# Patient Record
Sex: Female | Born: 1955
Health system: Southern US, Community
[De-identification: ages and names within clinical notes are randomized; demographics above are authoritative.]

## PROBLEM LIST (undated history)

## (undated) DIAGNOSIS — N200 Calculus of kidney: Secondary | ICD-10-CM

## (undated) DIAGNOSIS — I10 Essential (primary) hypertension: Secondary | ICD-10-CM

## (undated) DIAGNOSIS — E119 Type 2 diabetes mellitus without complications: Secondary | ICD-10-CM

## (undated) DIAGNOSIS — C439 Malignant melanoma of skin, unspecified: Secondary | ICD-10-CM

## (undated) DIAGNOSIS — M549 Dorsalgia, unspecified: Secondary | ICD-10-CM

## (undated) HISTORY — PX: SHOULDER ARTHROSCOPY: SHX128

## (undated) HISTORY — PX: ABDOMINAL HYSTERECTOMY: SHX81

## (undated) HISTORY — PX: KNEE ARTHROSCOPY: SUR90

## (undated) HISTORY — PX: MOUTH SURGERY: SHX715

## (undated) HISTORY — PX: GASTRECTOMY: SHX58

---

## 1997-08-23 ENCOUNTER — Encounter: Admission: RE | Admit: 1997-08-23 | Discharge: 1997-11-21 | Payer: Self-pay | Admitting: Family Medicine

## 1997-09-07 ENCOUNTER — Other Ambulatory Visit: Admission: RE | Admit: 1997-09-07 | Discharge: 1997-09-07 | Payer: Self-pay | Admitting: Family Medicine

## 1997-09-29 ENCOUNTER — Other Ambulatory Visit: Admission: RE | Admit: 1997-09-29 | Discharge: 1997-09-29 | Payer: Self-pay | Admitting: Family Medicine

## 1997-12-19 ENCOUNTER — Encounter: Admission: RE | Admit: 1997-12-19 | Discharge: 1998-03-19 | Payer: Self-pay | Admitting: Family Medicine

## 1998-07-05 ENCOUNTER — Ambulatory Visit (HOSPITAL_BASED_OUTPATIENT_CLINIC_OR_DEPARTMENT_OTHER): Admission: RE | Admit: 1998-07-05 | Discharge: 1998-07-05 | Payer: Self-pay | Admitting: General Surgery

## 2000-08-15 ENCOUNTER — Encounter: Payer: Self-pay | Admitting: Family Medicine

## 2000-08-15 ENCOUNTER — Encounter: Admission: RE | Admit: 2000-08-15 | Discharge: 2000-08-15 | Payer: Self-pay | Admitting: Family Medicine

## 2002-12-12 ENCOUNTER — Emergency Department (HOSPITAL_COMMUNITY): Admission: EM | Admit: 2002-12-12 | Discharge: 2002-12-12 | Payer: Self-pay | Admitting: Emergency Medicine

## 2005-12-02 ENCOUNTER — Encounter: Admission: RE | Admit: 2005-12-02 | Discharge: 2005-12-02 | Payer: Self-pay | Admitting: Family Medicine

## 2006-01-01 ENCOUNTER — Encounter: Admission: RE | Admit: 2006-01-01 | Discharge: 2006-01-01 | Payer: Self-pay | Admitting: Family Medicine

## 2006-06-28 ENCOUNTER — Encounter: Admission: RE | Admit: 2006-06-28 | Discharge: 2006-06-28 | Payer: Self-pay | Admitting: Orthopedic Surgery

## 2011-01-31 ENCOUNTER — Other Ambulatory Visit: Payer: Self-pay | Admitting: Obstetrics and Gynecology

## 2011-01-31 DIAGNOSIS — Z1231 Encounter for screening mammogram for malignant neoplasm of breast: Secondary | ICD-10-CM

## 2011-02-12 ENCOUNTER — Ambulatory Visit
Admission: RE | Admit: 2011-02-12 | Discharge: 2011-02-12 | Disposition: A | Discharge status: Home / Self Care | Payer: BC Managed Care – PPO | Source: Ambulatory Visit | Attending: Obstetrics and Gynecology | Admitting: Obstetrics and Gynecology

## 2011-02-12 DIAGNOSIS — Z1231 Encounter for screening mammogram for malignant neoplasm of breast: Secondary | ICD-10-CM

## 2012-01-24 ENCOUNTER — Other Ambulatory Visit: Payer: Self-pay | Admitting: Family Medicine

## 2012-01-24 DIAGNOSIS — Z1231 Encounter for screening mammogram for malignant neoplasm of breast: Secondary | ICD-10-CM

## 2012-03-03 ENCOUNTER — Ambulatory Visit
Admission: RE | Admit: 2012-03-03 | Discharge: 2012-03-03 | Disposition: A | Discharge status: Home / Self Care | Payer: BC Managed Care – PPO | Source: Ambulatory Visit | Attending: Family Medicine | Admitting: Family Medicine

## 2012-03-03 DIAGNOSIS — Z1231 Encounter for screening mammogram for malignant neoplasm of breast: Secondary | ICD-10-CM

## 2012-10-28 ENCOUNTER — Encounter: Payer: Self-pay | Admitting: Endocrinology

## 2012-10-28 ENCOUNTER — Ambulatory Visit (INDEPENDENT_AMBULATORY_CARE_PROVIDER_SITE_OTHER): Payer: BC Managed Care – PPO | Admitting: Endocrinology

## 2012-10-28 VITALS — BP 108/62 | HR 80 | Temp 97.9°F | Resp 12 | Ht 64.0 in | Wt 213.6 lb

## 2012-10-28 DIAGNOSIS — E119 Type 2 diabetes mellitus without complications: Secondary | ICD-10-CM

## 2012-10-28 LAB — HEMOGLOBIN A1C: Hgb A1c MFr Bld: 6.1 % (ref 4.6–6.5)

## 2012-10-28 LAB — COMPREHENSIVE METABOLIC PANEL
ALT: 18 U/L (ref 0–35)
AST: 19 U/L (ref 0–37)
Albumin: 3.7 g/dL (ref 3.5–5.2)
Calcium: 9.3 mg/dL (ref 8.4–10.5)
Chloride: 104 mEq/L (ref 96–112)
Potassium: 4.1 mEq/L (ref 3.5–5.1)

## 2012-10-28 LAB — LIPID PANEL: Total CHOL/HDL Ratio: 3

## 2012-10-28 LAB — MICROALBUMIN / CREATININE URINE RATIO: Microalb Creat Ratio: 0.6 mg/g (ref 0.0–30.0)

## 2012-10-28 NOTE — Patient Instructions (Addendum)
Please check blood sugars at least half the time about 2 hours after any meal and as directed on waking up. Please bring blood sugar monitor to each visit  Walk daily

## 2012-10-28 NOTE — Progress Notes (Signed)
Patient ID: Jo Roberts, female   DOB: 12/03/1955, 57 y.o.   MRN: [id number]  Jo Roberts is an 57 y.o. female.   Reason for Appointment: Diabetes follow-up   History of Present Illness    Diagnosis: Type 2 DIABETES MELITUS  She has had mild diabetes which has usually been well controlled. She did significantly better with her diet and weight control with adding Victoza and this has also stabilized her sugars No side effects with this Her A1c usually is in the upper normal range. The last HbgA1c was reported as 5.5 in 4/14 Detailed records are not available yet Her fasting readings are usually fairly good and recently has had excellent readings after meals  Oral hypoglycemic drugs: Metformin        Side effects from medications: None Compliance with medications and self-care: Usually fairly good, not exercising          Monitors blood glucose: Once a day.    Glucometer:           Blood Glucose readings from meter download: readings 2 hours after supper: 102-136 with average 121 Hypoglycemia frequency: Never.          Meals: 3 meals per day.          Physical activity: exercise: Has been a regular the last few months           Dietician visit: Most recent: Unknown         Wt Readings from Last 3 Encounters:  10/28/12 213 lb 9.6 oz (96.888 kg)    No results found for any previous visit.    Medication List       This list is accurate as of: 10/28/12  8:57 AM.  Always use your most recent med list.               ACCU-CHEK COMPACT STRIPS test strip  Generic drug:  glucose blood  1 each by Other route as needed for other. Use as instructed     ALPRAZolam 0.5 MG tablet  Commonly known as:  XANAX  Take 0.5 mg by mouth at bedtime as needed for sleep (1/2 to 1 tablet as needed).     aspirin 81 MG tablet  Take 81 mg by mouth daily.     estradiol 1 MG tablet  Commonly known as:  ESTRACE  Take 1 mg by mouth daily.     lisinopril 5 MG tablet  Commonly known as:   PRINIVIL,ZESTRIL  Take 5 mg by mouth daily.     metFORMIN 500 MG 24 hr tablet  Commonly known as:  GLUCOPHAGE-XR  Take 500 mg by mouth daily with breakfast. 3 with breakfast     VICTOZA 18 MG/3ML Sopn  Generic drug:  Liraglutide  1.2 mg.     zolpidem 10 MG tablet  Commonly known as:  AMBIEN  Take 10 mg by mouth at bedtime as needed for sleep.        Allergies:  Allergies  Allergen Reactions  . Codeine     hallucinations    No past medical history on file.  No past surgical history on file.  No family history on file.  Social History:  reports that she has never smoked. She does not have any smokeless tobacco history on file. Her alcohol and drug histories are not on file.  Review of Systems:  She has not had any history of hypertension and apparently is on lisinopril from PCP for benefit of renal  protection  LIPIDS: last LDL level was 58 and she has not required pharmacological treatment  History of insomnia, recently has had more stress also      Examination:   BP 108/62  Pulse 80  Temp(Src) 97.9 F (36.6 C)  Resp 12  Ht 5\' 4"  (1.626 m)  Wt 213 lb 9.6 oz (96.888 kg)  BMI 36.65 kg/m2  SpO2 98%  Body mass index is 36.65 kg/(m^2).   ASSESSMENT/ PLAN::   Diabetes type 2   The patient's diabetes control appears to be excellent with nearly normal readings after meals Usually her fasting readings are good but A1c is pending Discussed that she does need to be consistent with exercise regimen and maintain diet for further weight loss She will continue her regimen of Victoza and metformin which she is tolerating well She will followup in 6 months  Simona Rocque 10/28/2012, 8:57 AM   Office Visit on 10/28/2012  Component Date Value Range Status  . Microalb, Ur 10/28/2012 1.8  0.0 - 1.9 mg/dL Final  . Creatinine,U 60/45/4098 278.8   Final  . Microalb Creat Ratio 10/28/2012 0.6  0.0 - 30.0 mg/g Final  . Hemoglobin A1C 10/28/2012 6.1  4.6 - 6.5 % Final    Glycemic Control Guidelines for People with Diabetes:Non Diabetic:  <6%Goal of Therapy: <7%Additional Action Suggested:  >8%   . Sodium 10/28/2012 139  135 - 145 mEq/L Final  . Potassium 10/28/2012 4.1  3.5 - 5.1 mEq/L Final  . Chloride 10/28/2012 104  96 - 112 mEq/L Final  . CO2 10/28/2012 29  19 - 32 mEq/L Final  . Glucose, Bld 10/28/2012 91  70 - 99 mg/dL Final  . BUN 11/91/4782 14  6 - 23 mg/dL Final  . Creatinine, Ser 10/28/2012 0.7  0.4 - 1.2 mg/dL Final  . Total Bilirubin 10/28/2012 0.7  0.3 - 1.2 mg/dL Final  . Alkaline Phosphatase 10/28/2012 64  39 - 117 U/L Final  . AST 10/28/2012 19  0 - 37 U/L Final  . ALT 10/28/2012 18  0 - 35 U/L Final  . Total Protein 10/28/2012 7.2  6.0 - 8.3 g/dL Final  . Albumin 95/62/1308 3.7  3.5 - 5.2 g/dL Final  . Calcium 65/78/4696 9.3  8.4 - 10.5 mg/dL Final  . GFR 29/52/8413 87.19  >60.00 mL/min Final  . Cholesterol 10/28/2012 120  0 - 200 mg/dL Final   ATP III Classification       Desirable:  < 200 mg/dL               Borderline High:  200 - 239 mg/dL          High:  > = 244 mg/dL  . Triglycerides 10/28/2012 89.0  0.0 - 149.0 mg/dL Final   Normal:  <010 mg/dLBorderline High:  150 - 199 mg/dL  . HDL 10/28/2012 46.90  >39.00 mg/dL Final  . VLDL 27/25/3664 17.8  0.0 - 40.0 mg/dL Final  . LDL Cholesterol 10/28/2012 55  0 - 99 mg/dL Final  . Total CHOL/HDL Ratio 10/28/2012 3   Final                  Men          Women1/2 Average Risk     3.4          3.3Average Risk          5.0          4.42X Average Risk  9.6          7.13X Average Risk          15.0          11.0

## 2012-10-29 ENCOUNTER — Telehealth: Payer: Self-pay | Admitting: *Deleted

## 2012-10-29 NOTE — Progress Notes (Signed)
Quick Note:  Please let patient know that the lab results are all normal and no further action needed, A1c 6.1, higher than before but this lab has higher upper normal  ______

## 2012-10-29 NOTE — Telephone Encounter (Signed)
Pt is aware of test results.  

## 2012-10-29 NOTE — Telephone Encounter (Signed)
Message copied by Hermenia Bers on Thu Oct 29, 2012  8:17 AM ------      Message from: Reather Littler      Created: Thu Oct 29, 2012  8:09 AM       Please let patient know that the lab results are all normal and no further action needed, A1c 6.1, higher than before but this lab has higher upper normal       ------

## 2012-11-21 ENCOUNTER — Other Ambulatory Visit: Payer: Self-pay | Admitting: Endocrinology

## 2012-11-22 ENCOUNTER — Encounter: Payer: Self-pay | Admitting: Endocrinology

## 2012-11-23 ENCOUNTER — Other Ambulatory Visit: Payer: Self-pay | Admitting: *Deleted

## 2012-11-23 MED ORDER — LIRAGLUTIDE 18 MG/3ML ~~LOC~~ SOPN: 1.2000 mg | PEN_INJECTOR | Freq: Every day | SUBCUTANEOUS | Status: DC

## 2012-11-23 NOTE — Telephone Encounter (Signed)
Opened encounter in error  

## 2012-12-28 ENCOUNTER — Other Ambulatory Visit: Payer: Self-pay | Admitting: *Deleted

## 2012-12-28 MED ORDER — GLUCOSE BLOOD VI STRP: ORAL_STRIP | Status: AC

## 2012-12-29 ENCOUNTER — Other Ambulatory Visit: Payer: Self-pay | Admitting: *Deleted

## 2012-12-29 MED ORDER — METFORMIN HCL ER 500 MG PO TB24: ORAL_TABLET | ORAL | Status: DC

## 2013-01-21 ENCOUNTER — Other Ambulatory Visit: Payer: Self-pay

## 2013-03-04 ENCOUNTER — Other Ambulatory Visit: Payer: Self-pay

## 2013-03-04 DIAGNOSIS — Z1231 Encounter for screening mammogram for malignant neoplasm of breast: Secondary | ICD-10-CM

## 2013-03-08 ENCOUNTER — Other Ambulatory Visit: Payer: Self-pay | Admitting: *Deleted

## 2013-03-08 MED ORDER — METFORMIN HCL ER 500 MG PO TB24: ORAL_TABLET | ORAL | Status: DC

## 2013-03-16 ENCOUNTER — Ambulatory Visit
Admission: RE | Admit: 2013-03-16 | Discharge: 2013-03-16 | Disposition: A | Discharge status: Home / Self Care | Payer: BC Managed Care – PPO | Source: Ambulatory Visit

## 2013-03-16 DIAGNOSIS — Z1231 Encounter for screening mammogram for malignant neoplasm of breast: Secondary | ICD-10-CM

## 2013-08-23 ENCOUNTER — Other Ambulatory Visit: Payer: Self-pay | Admitting: Endocrinology

## 2013-11-17 ENCOUNTER — Telehealth: Payer: Self-pay | Admitting: Endocrinology

## 2013-11-17 NOTE — Telephone Encounter (Signed)
Records faxed.

## 2013-11-17 NOTE — Telephone Encounter (Signed)
Mellissa from [facility] need patient last office visit notes, and labs.  Attention Lenna Sciara Fax # [phone]

## 2014-02-10 ENCOUNTER — Encounter (HOSPITAL_BASED_OUTPATIENT_CLINIC_OR_DEPARTMENT_OTHER): Payer: Self-pay

## 2014-02-10 ENCOUNTER — Emergency Department (HOSPITAL_BASED_OUTPATIENT_CLINIC_OR_DEPARTMENT_OTHER)
Admission: EM | Admit: 2014-02-10 | Discharge: 2014-02-10 | Disposition: A | Discharge status: Home / Self Care | Payer: BC Managed Care – PPO | Attending: Emergency Medicine | Admitting: Emergency Medicine

## 2014-02-10 ENCOUNTER — Emergency Department (HOSPITAL_BASED_OUTPATIENT_CLINIC_OR_DEPARTMENT_OTHER): Payer: BC Managed Care – PPO

## 2014-02-10 DIAGNOSIS — N200 Calculus of kidney: Secondary | ICD-10-CM | POA: Insufficient documentation

## 2014-02-10 DIAGNOSIS — R109 Unspecified abdominal pain: Secondary | ICD-10-CM | POA: Diagnosis not present

## 2014-02-10 DIAGNOSIS — Z79899 Other long term (current) drug therapy: Secondary | ICD-10-CM | POA: Insufficient documentation

## 2014-02-10 DIAGNOSIS — E119 Type 2 diabetes mellitus without complications: Secondary | ICD-10-CM | POA: Diagnosis not present

## 2014-02-10 DIAGNOSIS — Z7982 Long term (current) use of aspirin: Secondary | ICD-10-CM | POA: Diagnosis not present

## 2014-02-10 DIAGNOSIS — M549 Dorsalgia, unspecified: Secondary | ICD-10-CM | POA: Diagnosis present

## 2014-02-10 DIAGNOSIS — I10 Essential (primary) hypertension: Secondary | ICD-10-CM | POA: Insufficient documentation

## 2014-02-10 DIAGNOSIS — N12 Tubulo-interstitial nephritis, not specified as acute or chronic: Secondary | ICD-10-CM

## 2014-02-10 HISTORY — DX: Type 2 diabetes mellitus without complications: E11.9

## 2014-02-10 HISTORY — DX: Essential (primary) hypertension: I10

## 2014-02-10 LAB — CBC WITH DIFFERENTIAL/PLATELET
BASOS ABS: 0 10*3/uL (ref 0.0–0.1)
BASOS PCT: 0 % (ref 0–1)
Eosinophils Absolute: 0 10*3/uL (ref 0.0–0.7)
Eosinophils Relative: 0 % (ref 0–5)
HEMATOCRIT: 37.3 % (ref 36.0–46.0)
HEMOGLOBIN: 12.1 g/dL (ref 12.0–15.0)
LYMPHS PCT: 8 % — AB (ref 12–46)
Lymphs Abs: 1.7 10*3/uL (ref 0.7–4.0)
MCH: 28 pg (ref 26.0–34.0)
MCHC: 32.4 g/dL (ref 30.0–36.0)
MCV: 86.3 fL (ref 78.0–100.0)
MONO ABS: 2 10*3/uL — AB (ref 0.1–1.0)
MONOS PCT: 10 % (ref 3–12)
NEUTROS ABS: 16.3 10*3/uL — AB (ref 1.7–7.7)
NEUTROS PCT: 82 % — AB (ref 43–77)
Platelets: 199 10*3/uL (ref 150–400)
RBC: 4.32 MIL/uL (ref 3.87–5.11)
RDW: 13.6 % (ref 11.5–15.5)
WBC: 20 10*3/uL — AB (ref 4.0–10.5)

## 2014-02-10 LAB — URINALYSIS, ROUTINE W REFLEX MICROSCOPIC
BILIRUBIN URINE: NEGATIVE
GLUCOSE, UA: NEGATIVE mg/dL
Ketones, ur: 15 mg/dL — AB
Nitrite: POSITIVE — AB
PROTEIN: 100 mg/dL — AB
Specific Gravity, Urine: 1.017 (ref 1.005–1.030)
UROBILINOGEN UA: 0.2 mg/dL (ref 0.0–1.0)
pH: 6 (ref 5.0–8.0)

## 2014-02-10 LAB — URINE MICROSCOPIC-ADD ON

## 2014-02-10 LAB — BASIC METABOLIC PANEL
ANION GAP: 14 (ref 5–15)
BUN: 9 mg/dL (ref 6–23)
CHLORIDE: 96 meq/L (ref 96–112)
CO2: 25 meq/L (ref 19–32)
Calcium: 9.2 mg/dL (ref 8.4–10.5)
Creatinine, Ser: 0.8 mg/dL (ref 0.50–1.10)
GFR calc non Af Amer: 80 mL/min — ABNORMAL LOW (ref >90)
Glucose, Bld: 176 mg/dL — ABNORMAL HIGH (ref 70–99)
POTASSIUM: 3.6 meq/L — AB (ref 3.7–5.3)
Sodium: 135 mEq/L — ABNORMAL LOW (ref 137–147)

## 2014-02-10 MED ORDER — CEFTRIAXONE SODIUM 1 G IJ SOLR
1.0000 g | Freq: Once | INTRAMUSCULAR | Status: AC
  Administered 2014-02-10: 1 g via INTRAVENOUS

## 2014-02-10 MED ORDER — ONDANSETRON HCL 4 MG/2ML IJ SOLN
4.0000 mg | Freq: Once | INTRAMUSCULAR | Status: AC
  Administered 2014-02-10: 4 mg via INTRAVENOUS
  Filled 2014-02-10: qty 2

## 2014-02-10 MED ORDER — ACETAMINOPHEN 325 MG PO TABS
650.0000 mg | ORAL_TABLET | Freq: Once | ORAL | Status: AC
  Administered 2014-02-10: 650 mg via ORAL
  Filled 2014-02-10: qty 2

## 2014-02-10 MED ORDER — CEFTRIAXONE SODIUM 1 G IJ SOLR
INTRAMUSCULAR | Status: AC
  Filled 2014-02-10: qty 10

## 2014-02-10 MED ORDER — SODIUM CHLORIDE 0.9 % IV BOLUS (SEPSIS)
1000.0000 mL | Freq: Once | INTRAVENOUS | Status: AC
  Administered 2014-02-10: 1000 mL via INTRAVENOUS

## 2014-02-10 MED ORDER — ONDANSETRON HCL 4 MG PO TABS: 4.0000 mg | ORAL_TABLET | Freq: Four times a day (QID) | ORAL | Status: DC

## 2014-02-10 MED ORDER — LEVOFLOXACIN 750 MG PO TABS
750.0000 mg | ORAL_TABLET | Freq: Once | ORAL | Status: AC
  Administered 2014-02-10: 750 mg via ORAL
  Filled 2014-02-10: qty 1

## 2014-02-10 MED ORDER — AMOXICILLIN-POT CLAVULANATE 875-125 MG PO TABS
1.0000 | ORAL_TABLET | Freq: Two times a day (BID) | ORAL | Status: DC
Start: 2014-02-10 — End: 2016-08-05

## 2014-02-10 NOTE — Discharge Instructions (Signed)
Please follow the directions provided.  Be sure to follow-up with your primary care provider as soon as possible.  You may also contact the urologist of choice to follow-up with the stone identified today.  You have a urinary tract infection and a kidney stone but your pain is not on the left side where the stone is located.  You should still strain your urine for the stone.  If your pain, fever or any other symptoms do not improve or worsen in any way, do not hesitate to return.     SEEK IMMEDIATE MEDICAL CARE IF:  You have a fever or persistent symptoms for more than 2-3 days.  You have a fever and your symptoms suddenly get worse.  You are unable to take your antibiotics or fluids.  You develop shaking chills.  You experience extreme weakness or fainting.  There is no improvement after 2 days of treatment.

## 2014-02-10 NOTE — ED Provider Notes (Signed)
CSN: [id number]     Arrival date & time 02/10/14  1237 History   First MD Initiated Contact with Patient 02/10/14 1358     Chief Complaint  Patient presents with  . back pain-from [place]    (Consider location/radiation/quality/duration/timing/severity/associated sxs/prior Treatment) HPI  Jo Roberts is a 58 yo female presenting with nausea, chills, and lower abd pain last night. She reports the pain progressed to right sided back and flank pain last night.  She had difficulty sleeping lsst night because of the chills and pain. This morning, she had a fever of 101.5. She was seen in a walk-in clinic and sent to the ED for further evaluation. Her last bowel movement was Tuesday. She describes the pain as a constant aching in her suprapubic abdomen that radiates to her right flank and right lower back.  She rates the pain as a 6/10.  She denies any chest pain, shortness of breath, weakness , vomiting or dysuria.   Past Medical History  Diagnosis Date  . Diabetes mellitus without complication   . Hypertension    History reviewed. No pertinent past surgical history. No family history on file. History  Substance Use Topics  . Smoking status: Never Smoker   . Smokeless tobacco: Not on file  . Alcohol Use: Not on file   OB History    No data available     Review of Systems  Constitutional: Positive for fever and chills.  HENT: Negative for sore throat.   Eyes: Negative for visual disturbance.  Respiratory: Negative for cough and shortness of breath.   Cardiovascular: Negative for chest pain and leg swelling.  Gastrointestinal: Positive for nausea and abdominal pain. Negative for vomiting and diarrhea.  Genitourinary: Positive for flank pain. Negative for dysuria.  Musculoskeletal: Positive for back pain. Negative for myalgias.  Skin: Negative for rash.  Neurological: Negative for weakness, numbness and headaches.   Allergies  Codeine  Home Medications   Prior to Admission  medications   Medication Sig Start Date End Date Taking? Authorizing Provider  ALPRAZolam Duanne Moron) 0.5 MG tablet Take 0.5 mg by mouth at bedtime as needed for sleep (1/2 to 1 tablet as needed).    Historical Provider, MD  aspirin 81 MG tablet Take 81 mg by mouth daily.    Historical Provider, MD  estradiol (ESTRACE) 1 MG tablet Take 1 mg by mouth daily.    Historical Provider, MD  glucose blood (ACCU-CHEK COMPACT STRIPS) test strip Use as instructed to check blood sugars once a day 12/28/12   Elayne Snare, MD  lisinopril (PRINIVIL,ZESTRIL) 5 MG tablet Take 5 mg by mouth daily.    Historical Provider, MD  metFORMIN (GLUCOPHAGE-XR) 500 MG 24 hr tablet Take 3 tablets  with breakfast 03/08/13   Elayne Snare, MD  zolpidem (AMBIEN) 10 MG tablet Take 10 mg by mouth at bedtime as needed for sleep.    Historical Provider, MD   BP 143/67 mmHg  Pulse 121  Temp(Src) 101.2 F (38.4 C) (Oral)  Resp 18  Wt 224 lb (101.606 kg)  SpO2 96% Physical Exam  Constitutional: She appears well-developed and well-nourished. No distress.  HENT:  Head: Normocephalic and atraumatic.  Mouth/Throat: Oropharynx is clear and moist. No oropharyngeal exudate.  Eyes: Conjunctivae are normal.  Neck: Neck supple. No thyromegaly present.  Cardiovascular: Normal rate, regular rhythm and intact distal pulses.   Pulmonary/Chest: Effort normal and breath sounds normal. No respiratory distress. She has no wheezes. She has no rales. She exhibits no  tenderness.  Abdominal: Soft. She exhibits no distension and no mass. There is tenderness in the suprapubic area. There is no rigidity, no rebound, no guarding, no CVA tenderness, no tenderness at McBurney's point and negative Murphy's sign.    Musculoskeletal: She exhibits no tenderness.  Lymphadenopathy:    She has no cervical adenopathy.  Neurological: She is alert.  Skin: Skin is warm and dry. No rash noted. She is not diaphoretic.  Psychiatric: She has a normal mood and affect.    Nursing note and vitals reviewed.   ED Course  Procedures (including critical care time) Labs Review Labs Reviewed  URINALYSIS, ROUTINE W REFLEX MICROSCOPIC - Abnormal; Notable for the following:    Color, Urine AMBER (*)    APPearance CLOUDY (*)    Hgb urine dipstick TRACE (*)    Ketones, ur 15 (*)    Protein, ur 100 (*)    Nitrite POSITIVE (*)    Leukocytes, UA SMALL (*)    All other components within normal limits  URINE MICROSCOPIC-ADD ON - Abnormal; Notable for the following:    Bacteria, UA MANY (*)    All other components within normal limits  CBC WITH DIFFERENTIAL - Abnormal; Notable for the following:    WBC 20.0 (*)    Neutrophils Relative % 82 (*)    Neutro Abs 16.3 (*)    Lymphocytes Relative 8 (*)    Monocytes Absolute 2.0 (*)    All other components within normal limits  BASIC METABOLIC PANEL - Abnormal; Notable for the following:    Sodium 135 (*)    Potassium 3.6 (*)    Glucose, Bld 176 (*)    GFR calc non Af Amer 80 (*)    All other components within normal limits  URINE CULTURE  CULTURE, BLOOD (ROUTINE X 2)  CULTURE, BLOOD (ROUTINE X 2)   Imaging Review    CT RENAL STONE STUDY (Final result) Result time: 02/10/14 14:36:27   Final result by Rad Results In Interface (02/10/14 14:36:27)   Narrative:   CLINICAL DATA: Low back pain and hematuria  EXAM: CT ABDOMEN AND PELVIS WITHOUT CONTRAST  TECHNIQUE: Multidetector CT imaging of the abdomen and pelvis was performed following the standard protocol without oral or intravenous contrast material administration.  COMPARISON: None.  FINDINGS: There is mild scarring in the left lung base. No edema or consolidation in the lung bases appreciable.  Liver is prominent, measuring 19.7 cm in length. There is hepatic steatosis. No focal liver lesions are identified. Gallbladder is absent. There is no appreciable biliary duct dilatation.  Spleen, pancreas, and adrenals appear normal.  There are is  no calculus, hydronephrosis, or mass in the right kidney. There is no right-sided ureteral calculus. On the left, there is perinephric stranding with moderate hydronephrosis. There is known left renal mass or intrarenal calculus. There is diffuse ureterectasis on the left. There is a 4 mm calculus at the level of the left ureterovesical junction. No other ureteral calculi are identified.  In the pelvis, the urinary bladder is midline with normal wall thickness. There is no appreciable pelvic mass or fluid collection. Uterus appears absent. Appendix appears normal. There is some mild mesenteric stranding in the left retroperitoneum, likely due to edema from the ureteral calculus on the left.  There is no bowel obstruction. No free air or portal venous air. There is no ascites, adenopathy, or abscess in the abdomen or pelvis. There is no demonstrable abdominal aortic aneurysm. There is degenerative change in  the lumbar spine. There are no blastic or lytic bone lesions. There is generalized spinal stenosis at L4-5, due to generalized disc protrusion, facet hypertrophy, and ligamentum flava hypertrophy bilaterally. There are no blastic or lytic bone lesions.  IMPRESSION: 4 mm calculus at the left ureterovesical junction causing moderate hydronephrosis and ureterectasis on the left.  Spinal stenosis at L4-5, multifactorial.  Prominent liver with hepatic steatosis.  No bowel obstruction. No abscess. Appendix appears normal.     EKG Interpretation None      MDM   Final diagnoses:  Flank pain  Pyelonephritis   57 yo female presenting with suprapubic and right flank pain.   CBC, BMP, UA, CT Stone Study, NS bolus, tylenol and zofran.  Labs reviewed: elevated WBC and Ab neutro, UA: positive for nitrites and small leukocytes, WBC in urine 21-50.  Blood and Urine culture added.  CT resulted: stone noted on left but none on right where pt's pain is.  Discussed case with Dr.  Johnney Killian.  Will treat as pyelonephritis.   IV rocephin and Levaquin Po given.  Discussed results with pt, would prefer discharge today because of responsibilities at home. Pt is well-appearing, in no acute distress and vital signs are stable.  They appear safe to be discharged.  Discharge include follow-up with their PCP and antibiotic to further treat pyelonephritis. Strict return precautions provided.  Pt aware of plan and in agreement.      Filed Vitals:   02/10/14 1246 02/10/14 1524 02/10/14 1738  BP: 143/67 113/51 116/64  Pulse: 121 86 80  Temp: 101.2 F (38.4 C) 98.5 F (36.9 C) 98 F (36.7 C)  TempSrc: Oral Oral Oral  Resp: 18 18 16   Weight: 224 lb (101.606 kg)    SpO2: 96% 99% 100%   Meds given in ED:  Medications  cefTRIAXone (ROCEPHIN) 1 g in dextrose 5 % 50 mL IVPB (not administered)  levofloxacin (LEVAQUIN) tablet 750 mg (not administered)  acetaminophen (TYLENOL) tablet 650 mg (650 mg Oral Given 02/10/14 1311)  ondansetron (ZOFRAN) injection 4 mg (4 mg Intravenous Given 02/10/14 1444)  sodium chloride 0.9 % bolus 1,000 mL (1,000 mLs Intravenous New Bag/Given 02/10/14 1444)    New Prescriptions   AMOXICILLIN-CLAVULANATE (AUGMENTIN) 875-125 MG PER TABLET    Take 1 tablet by mouth every 12 (twelve) hours.   ONDANSETRON (ZOFRAN) 4 MG TABLET    Take 1 tablet (4 mg total) by mouth every 6 (six) hours.       Britt Bottom, NP 02/13/14 [place], MD 02/13/14 [phone]

## 2014-02-10 NOTE — ED Notes (Signed)
Patient here from [facility] walk in clinic for further evaluation of back pain, chills and decreased urinary output x 1 day.

## 2014-02-12 LAB — URINE CULTURE: Special Requests: NORMAL

## 2014-02-13 NOTE — Progress Notes (Signed)
Pt was recently seen in the ED for UTI. Urine cx came back with klebsiella that is resistant to amp. She was dc on augmentin. Confirmed with lab today that it's sens to augmentin.   Onnie Boer, PharmD Pager: [phone] 02/13/2014 8:20 AM

## 2014-02-16 LAB — CULTURE, BLOOD (ROUTINE X 2)
CULTURE: NO GROWTH
Culture: NO GROWTH
SPECIAL REQUESTS: NORMAL
Special Requests: NORMAL

## 2014-03-02 ENCOUNTER — Other Ambulatory Visit: Payer: Self-pay

## 2014-03-02 DIAGNOSIS — Z1231 Encounter for screening mammogram for malignant neoplasm of breast: Secondary | ICD-10-CM

## 2014-03-16 ENCOUNTER — Ambulatory Visit
Admission: RE | Admit: 2014-03-16 | Discharge: 2014-03-16 | Disposition: A | Discharge status: Home / Self Care | Payer: BC Managed Care – PPO | Source: Ambulatory Visit

## 2014-03-16 DIAGNOSIS — Z1231 Encounter for screening mammogram for malignant neoplasm of breast: Secondary | ICD-10-CM

## 2016-08-05 ENCOUNTER — Encounter (HOSPITAL_BASED_OUTPATIENT_CLINIC_OR_DEPARTMENT_OTHER): Payer: Self-pay | Admitting: Emergency Medicine

## 2016-08-05 ENCOUNTER — Emergency Department (HOSPITAL_BASED_OUTPATIENT_CLINIC_OR_DEPARTMENT_OTHER)
Admission: EM | Admit: 2016-08-05 | Discharge: 2016-08-05 | Disposition: A | Discharge status: Home / Self Care | Payer: BC Managed Care – PPO | Attending: Emergency Medicine | Admitting: Emergency Medicine

## 2016-08-05 ENCOUNTER — Emergency Department (HOSPITAL_BASED_OUTPATIENT_CLINIC_OR_DEPARTMENT_OTHER): Payer: BC Managed Care – PPO

## 2016-08-05 DIAGNOSIS — R109 Unspecified abdominal pain: Secondary | ICD-10-CM

## 2016-08-05 DIAGNOSIS — Z7982 Long term (current) use of aspirin: Secondary | ICD-10-CM | POA: Insufficient documentation

## 2016-08-05 DIAGNOSIS — I10 Essential (primary) hypertension: Secondary | ICD-10-CM | POA: Insufficient documentation

## 2016-08-05 DIAGNOSIS — E119 Type 2 diabetes mellitus without complications: Secondary | ICD-10-CM | POA: Diagnosis not present

## 2016-08-05 DIAGNOSIS — Z79899 Other long term (current) drug therapy: Secondary | ICD-10-CM | POA: Insufficient documentation

## 2016-08-05 DIAGNOSIS — N201 Calculus of ureter: Secondary | ICD-10-CM | POA: Diagnosis not present

## 2016-08-05 HISTORY — DX: Calculus of kidney: N20.0

## 2016-08-05 HISTORY — DX: Dorsalgia, unspecified: M54.9

## 2016-08-05 LAB — CBC WITH DIFFERENTIAL/PLATELET
Basophils Absolute: 0 10*3/uL (ref 0.0–0.1)
Basophils Relative: 0 %
EOS ABS: 0.1 10*3/uL (ref 0.0–0.7)
Eosinophils Relative: 1 %
HCT: 37.2 % (ref 36.0–46.0)
HEMOGLOBIN: 12.3 g/dL (ref 12.0–15.0)
Lymphocytes Relative: 22 %
Lymphs Abs: 1.9 10*3/uL (ref 0.7–4.0)
MCH: 28.1 pg (ref 26.0–34.0)
MCHC: 33.1 g/dL (ref 30.0–36.0)
MCV: 85.1 fL (ref 78.0–100.0)
Monocytes Absolute: 0.7 10*3/uL (ref 0.1–1.0)
Monocytes Relative: 9 %
NEUTROS PCT: 68 %
Neutro Abs: 5.8 10*3/uL (ref 1.7–7.7)
Platelets: 145 10*3/uL — ABNORMAL LOW (ref 150–400)
RBC: 4.37 MIL/uL (ref 3.87–5.11)
RDW: 14.4 % (ref 11.5–15.5)
WBC: 8.6 10*3/uL (ref 4.0–10.5)

## 2016-08-05 LAB — COMPREHENSIVE METABOLIC PANEL
ALK PHOS: 85 U/L (ref 38–126)
ALT: 27 U/L (ref 14–54)
ANION GAP: 11 (ref 5–15)
AST: 26 U/L (ref 15–41)
Albumin: 3.8 g/dL (ref 3.5–5.0)
BUN: 16 mg/dL (ref 6–20)
CALCIUM: 9.3 mg/dL (ref 8.9–10.3)
CO2: 23 mmol/L (ref 22–32)
Chloride: 105 mmol/L (ref 101–111)
Creatinine, Ser: 0.84 mg/dL (ref 0.44–1.00)
GFR calc non Af Amer: >60 mL/min (ref >60)
Glucose, Bld: 142 mg/dL — ABNORMAL HIGH (ref 65–99)
POTASSIUM: 4 mmol/L (ref 3.5–5.1)
SODIUM: 139 mmol/L (ref 135–145)
Total Bilirubin: 0.4 mg/dL (ref 0.3–1.2)
Total Protein: 6.9 g/dL (ref 6.5–8.1)

## 2016-08-05 LAB — URINALYSIS, MICROSCOPIC (REFLEX)

## 2016-08-05 LAB — URINALYSIS, ROUTINE W REFLEX MICROSCOPIC
Glucose, UA: NEGATIVE mg/dL
Ketones, ur: 15 mg/dL — AB
NITRITE: NEGATIVE
Protein, ur: 30 mg/dL — AB
SPECIFIC GRAVITY, URINE: 1.035 — AB (ref 1.005–1.030)
pH: 5.5 (ref 5.0–8.0)

## 2016-08-05 LAB — LIPASE, BLOOD: Lipase: 38 U/L (ref 11–51)

## 2016-08-05 MED ORDER — HYDROMORPHONE HCL 1 MG/ML IJ SOLN
0.5000 mg | INTRAMUSCULAR | Status: DC | PRN
Start: 2016-08-05 — End: 2016-08-05
  Administered 2016-08-05: 0.5 mg via INTRAVENOUS
  Filled 2016-08-05: qty 1

## 2016-08-05 MED ORDER — SODIUM CHLORIDE 0.9 % IV SOLN: INTRAVENOUS | Status: DC

## 2016-08-05 MED ORDER — ONDANSETRON HCL 4 MG/2ML IJ SOLN
4.0000 mg | Freq: Once | INTRAMUSCULAR | Status: AC
  Administered 2016-08-05: 4 mg via INTRAVENOUS
  Filled 2016-08-05: qty 2

## 2016-08-05 MED ORDER — ONDANSETRON HCL 4 MG PO TABS: 4.0000 mg | ORAL_TABLET | Freq: Four times a day (QID) | ORAL | 0 refills | Status: DC

## 2016-08-05 MED ORDER — OXYCODONE-ACETAMINOPHEN 5-325 MG PO TABS: 1.0000 | ORAL_TABLET | Freq: Four times a day (QID) | ORAL | 0 refills | Status: DC | PRN

## 2016-08-05 MED ORDER — SODIUM CHLORIDE 0.9 % IV BOLUS (SEPSIS)
1000.0000 mL | Freq: Once | INTRAVENOUS | Status: AC
  Administered 2016-08-05: 1000 mL via INTRAVENOUS

## 2016-08-05 MED FILL — ONDANSETRON HCL 4 MG TABLET: 4 | 3 days supply | Qty: 12 | Fill #0

## 2016-08-05 MED FILL — OXYCODONE/APAP 5/325 MG TAB: 5-325 | 5 days supply | Qty: 20 | Fill #0

## 2016-08-05 NOTE — Discharge Instructions (Signed)
Take the medications as needed for pain, follow-up with the urologist to make sure the kidney stone passes, return to [facility] Emergency room as needed for worsening symptoms (if you needed additional emergent treatment, the urologist work out at [place])

## 2016-08-05 NOTE — ED Triage Notes (Signed)
Pt states last night she started having right side back pain.  Started having flank pain today work with nausea.  Pt has hx of kidney stones

## 2016-08-05 NOTE — ED Provider Notes (Signed)
[facility] DEPT [facility] Provider Note   CSN: [id number] Arrival date & time: 08/05/16  1202     History   Chief Complaint Chief Complaint  Patient presents with  . Back Pain  . Abdominal Pain    HPI Jo Roberts is a 61 y.o. female.  HPI Patient presents to emergency room with complaints of right-sided flank pain. Patient started had some pain last evening. She took a portion of a hydrocodone tablet. She was able to go to bed and when she woke up this morning she was feeling fine. Patient was at work today however when she had the sudden onset of sharp stabbing pain in the right flank. It started radiating towards the front of her abdomen. She has had kidney stones before but they were not this severe. She began nauseated and felt like she was getting lightheaded. She came to the emergency room for evaluation. Her pain has subsequently resolved and she is feeling fine right now. She denies any trouble with any chest pain or shortness of breath. No diarrhea. No hematuria Past Medical History:  Diagnosis Date  . Back pain   . Diabetes mellitus without complication ([place])   . Hypertension   . Kidney stone     Patient Active Problem List   Diagnosis Date Noted  . Type II or unspecified type diabetes mellitus without mention of complication, not stated as uncontrolled 10/28/2012    Past Surgical History:  Procedure Laterality Date  . ABDOMINAL HYSTERECTOMY    . GASTRECTOMY    . KNEE ARTHROSCOPY Right   . SHOULDER ARTHROSCOPY Right     OB History    No data available       Home Medications    Prior to Admission medications   Medication Sig Start Date End Date Taking? Authorizing Provider  ALPRAZolam Duanne Moron) 0.5 MG tablet Take 0.5 mg by mouth at bedtime as needed for sleep (1/2 to 1 tablet as needed).    [provider]  aspirin 81 MG tablet Take 81 mg by mouth daily.    [provider]  estradiol (ESTRACE) 1 MG tablet Take 1 mg by mouth daily.     [provider]  glucose blood (ACCU-CHEK COMPACT STRIPS) test strip Use as instructed to check blood sugars once a day 12/28/12   Elayne Snare, MD  lisinopril (PRINIVIL,ZESTRIL) 5 MG tablet Take 5 mg by mouth daily.    [provider]  metFORMIN (GLUCOPHAGE-XR) 500 MG 24 hr tablet Take 3 tablets  with breakfast 03/08/13   Elayne Snare, MD  ondansetron (ZOFRAN) 4 MG tablet Take 1 tablet (4 mg total) by mouth every 6 (six) hours. 08/05/16   Dorie Rank, MD  oxyCODONE-acetaminophen (PERCOCET) 5-325 MG tablet Take 1 tablet by mouth every 6 (six) hours as needed. 08/05/16   Dorie Rank, MD  zolpidem (AMBIEN) 10 MG tablet Take 10 mg by mouth at bedtime as needed for sleep.    [provider]    Family History No family history on file.  Social History Social History  Substance Use Topics  . Smoking status: Never Smoker  . Smokeless tobacco: Never Used  . Alcohol use No     Allergies   Codeine   Review of Systems Review of Systems  All other systems reviewed and are negative.    Physical Exam Updated Vital Signs BP (!) 175/74   Pulse 79   Temp 98.3 F (36.8 C) (Oral)   Resp 18   Ht 1.626  m (5\' 4" )   Wt 108.4 kg (239 lb)   SpO2 97%   BMI 41.02 kg/m   Physical Exam  Constitutional: She appears well-developed and well-nourished. No distress.  HENT:  Head: Normocephalic and atraumatic.  Right Ear: External ear normal.  Left Ear: External ear normal.  Eyes: Conjunctivae are normal. Right eye exhibits no discharge. Left eye exhibits no discharge. No scleral icterus.  Neck: Neck supple. No tracheal deviation present.  Cardiovascular: Normal rate, regular rhythm and intact distal pulses.   Pulmonary/Chest: Effort normal and breath sounds normal. No stridor. No respiratory distress. She has no wheezes. She has no rales.  Abdominal: Soft. Bowel sounds are normal. She exhibits no distension. There is no tenderness. There is no rebound and no guarding.    Musculoskeletal: She exhibits no edema or tenderness.  Neurological: She is alert. She has normal strength. No cranial nerve deficit (no facial droop, extraocular movements intact, no slurred speech) or sensory deficit. She exhibits normal muscle tone. She displays no seizure activity. Coordination normal.  Skin: Skin is warm and dry. No rash noted.  Psychiatric: She has a normal mood and affect.  Nursing note and vitals reviewed.    ED Treatments / Results  Labs (all labs ordered are listed, but only abnormal results are displayed) Labs Reviewed  COMPREHENSIVE METABOLIC PANEL - Abnormal; Notable for the following:       Result Value   Glucose, Bld 142 (*)    All other components within normal limits  CBC WITH DIFFERENTIAL/PLATELET - Abnormal; Notable for the following:    Platelets 145 (*)    All other components within normal limits  URINALYSIS, ROUTINE W REFLEX MICROSCOPIC - Abnormal; Notable for the following:    Color, Urine AMBER (*)    APPearance CLOUDY (*)    Specific Gravity, Urine 1.035 (*)    Hgb urine dipstick LARGE (*)    Bilirubin Urine SMALL (*)    Ketones, ur 15 (*)    Protein, ur 30 (*)    Leukocytes, UA TRACE (*)    All other components within normal limits  URINALYSIS, MICROSCOPIC (REFLEX) - Abnormal; Notable for the following:    Bacteria, UA RARE (*)    Squamous Epithelial / LPF 0-5 (*)    All other components within normal limits  LIPASE, BLOOD     Radiology Ct Renal Stone Study  Result Date: 08/05/2016 CLINICAL DATA:  Acute right flank pain. EXAM: CT ABDOMEN AND PELVIS WITHOUT CONTRAST TECHNIQUE: Multidetector CT imaging of the abdomen and pelvis was performed following the standard protocol without IV contrast. COMPARISON:  CT scan of February 10, 2014. FINDINGS: Lower chest: No acute abnormality. Hepatobiliary: Fatty infiltration of the liver is noted. Status post cholecystectomy. No biliary dilatation. Pancreas: Unremarkable. No pancreatic ductal  dilatation or surrounding inflammatory changes. Spleen: Normal in size without focal abnormality. Adrenals/Urinary Tract: Adrenal glands appear normal. Nonobstructive calculus is noted in lower pole collecting system of left kidney. Mild right hydronephrosis is noted secondary to 5 mm linear calculus at right ureterovesical junction. Urinary bladder appears normal. Stomach/Bowel: Stomach is within normal limits. Appendix appears normal. No evidence of bowel wall thickening, distention, or inflammatory changes. Vascular/Lymphatic: No significant vascular findings are present. No enlarged abdominal or pelvic lymph nodes. Reproductive: Status post hysterectomy. No adnexal masses. Other: No abdominal wall hernia or abnormality. No abdominopelvic ascites. Musculoskeletal: No acute or significant osseous findings. IMPRESSION: Fatty infiltration of the liver. Nonobstructive left renal calculus. Mild right hydronephrosis secondary to 5  mm linear calculus at right ureterovesical junction. Electronically Signed   By: Marijo Conception, M.D.   On: 08/05/2016 13:02    Procedures Procedures (including critical care time)  Medications Ordered in ED Medications  sodium chloride 0.9 % bolus 1,000 mL (1,000 mLs Intravenous New Bag/Given 08/05/16 1300)    And  0.9 %  sodium chloride infusion (not administered)  HYDROmorphone (DILAUDID) injection 0.5 mg (0.5 mg Intravenous Given 08/05/16 1259)  ondansetron (ZOFRAN) injection 4 mg (4 mg Intravenous Given 08/05/16 1259)     Initial Impression / Assessment and Plan / ED Course  I have reviewed the triage vital signs and the nursing notes.  Pertinent labs & imaging results that were available during my care of the patient were reviewed by me and considered in my medical decision making (see chart for details).   patient presented to the emergency room with acute flank pain. Urinalysis shows a large amount of hematuria. CT scan confirms a 5 mm right ureteral stone. His pain  was controlled here in the emergency room. We will discharge home with outpatient prescriptions and urology follow-up. Warning signs and precautions discussed  Final Clinical Impressions(s) / ED Diagnoses   Final diagnoses:  Right ureteral stone    New Prescriptions New Prescriptions   ONDANSETRON (ZOFRAN) 4 MG TABLET    Take 1 tablet (4 mg total) by mouth every 6 (six) hours.   OXYCODONE-ACETAMINOPHEN (PERCOCET) 5-325 MG TABLET    Take 1 tablet by mouth every 6 (six) hours as needed.     Dorie Rank, MD 08/05/16 1416

## 2017-01-29 ENCOUNTER — Other Ambulatory Visit: Payer: Self-pay | Admitting: Family Medicine

## 2017-01-29 ENCOUNTER — Ambulatory Visit
Admission: RE | Admit: 2017-01-29 | Discharge: 2017-01-29 | Disposition: A | Discharge status: Home / Self Care | Payer: BC Managed Care – PPO | Source: Ambulatory Visit | Attending: Family Medicine | Admitting: Family Medicine

## 2017-01-29 DIAGNOSIS — M545 Low back pain: Secondary | ICD-10-CM

## 2019-01-01 ENCOUNTER — Other Ambulatory Visit: Payer: Self-pay

## 2019-01-01 DIAGNOSIS — Z20822 Contact with and (suspected) exposure to covid-19: Secondary | ICD-10-CM

## 2019-01-03 LAB — NOVEL CORONAVIRUS, NAA: SARS-CoV-2, NAA: NOT DETECTED

## 2019-01-15 ENCOUNTER — Other Ambulatory Visit: Payer: Self-pay

## 2019-01-15 DIAGNOSIS — Z20822 Contact with and (suspected) exposure to covid-19: Secondary | ICD-10-CM

## 2019-01-16 LAB — NOVEL CORONAVIRUS, NAA: SARS-CoV-2, NAA: NOT DETECTED

## 2019-04-15 ENCOUNTER — Other Ambulatory Visit: Payer: Self-pay | Admitting: Neurosurgery

## 2019-04-15 DIAGNOSIS — M5440 Lumbago with sciatica, unspecified side: Secondary | ICD-10-CM

## 2019-04-29 ENCOUNTER — Ambulatory Visit
Admission: RE | Admit: 2019-04-29 | Discharge: 2019-04-29 | Disposition: A | Discharge status: Home / Self Care | Payer: BC Managed Care – PPO | Source: Ambulatory Visit | Attending: Neurosurgery | Admitting: Neurosurgery

## 2019-04-29 DIAGNOSIS — M5440 Lumbago with sciatica, unspecified side: Secondary | ICD-10-CM

## 2019-07-05 ENCOUNTER — Ambulatory Visit: Payer: BC Managed Care – PPO | Admitting: Orthopaedic Surgery

## 2019-07-05 ENCOUNTER — Other Ambulatory Visit: Payer: Self-pay

## 2019-07-05 ENCOUNTER — Ambulatory Visit: Payer: Self-pay

## 2019-07-05 DIAGNOSIS — Z96612 Presence of left artificial shoulder joint: Secondary | ICD-10-CM

## 2019-07-05 DIAGNOSIS — M25512 Pain in left shoulder: Secondary | ICD-10-CM

## 2019-07-05 MED ORDER — METHYLPREDNISOLONE ACETATE 40 MG/ML IJ SUSP
40.0000 mg | INTRAMUSCULAR | Status: AC | PRN
  Administered 2019-07-05: 40 mg via INTRA_ARTICULAR

## 2019-07-05 MED ORDER — LIDOCAINE HCL 1 % IJ SOLN
3.0000 mL | INTRAMUSCULAR | Status: AC | PRN
  Administered 2019-07-05: 3 mL

## 2019-07-05 NOTE — Progress Notes (Signed)
Office Visit Note   Patient: Jo Roberts           Date of Birth: [birth year]           MRN: [id number] Visit Date: 07/05/2019              Requested by: Vernie Shanks, MD [place],  [place] [id number] PCP: Vernie Shanks, MD   Assessment & Plan: Visit Diagnoses:  1. Left shoulder pain, unspecified chronicity     Plan: I do feel it is important to try a steroid injection in her left shoulder today to calm down the acute pain of her left shoulder.  She agrees with this as well.  We will also set her up for outpatient physical therapy for her shoulder and I do feel that it is appropriate to obtain an MRI of her left shoulder rule out a rotator cuff tear given the profound weakness of her left shoulder and the fact that she is using her deltoids to abduct her shoulder.  She agrees with this treatment plan as well.  All question concerns were answered and addressed.  We will see her back in 4 weeks because by then she will have been to some therapy on the shoulder, we can see how the steroid is done for her and a MRI will be back to help guide [place] in our next direction.  Follow-Up Instructions: Return in about 4 weeks (around 08/02/2019).   Orders:  Orders Placed This Encounter  Procedures  . Large Joint Inj  . XR Shoulder Left   No orders of the defined types were placed in this encounter.     Procedures: Large Joint Inj: L subacromial bursa on 07/05/2019 2:24 PM Indications: pain and diagnostic evaluation Details: 22 G 1.5 in needle  Arthrogram: No  Medications: 3 mL lidocaine 1 %; 40 mg methylPREDNISolone acetate 40 MG/ML Outcome: tolerated well, no immediate complications Procedure, treatment alternatives, risks and benefits explained, specific risks discussed. Consent was given by the patient. Immediately prior to procedure a time out was called to verify the correct patient, procedure, equipment, support staff and site/side marked as required. Patient was  prepped and draped in the usual sterile fashion.       Clinical Data: No additional findings.   Subjective: Chief Complaint  Patient presents with  . Left Shoulder - Pain  The patient is someone I am seeing for the first time.  She comes in with a chief complaint of left shoulder pain is been going on for over a year now.  She does have a remote history of a right open rotator cuff repair done many years ago.  She is having a lot of problems sleeping at night due to her left shoulder pain.  She has problems reaching behind her and overhead and there is some weakness in that shoulder.  She is 64 years old.  She is not a diabetic.  She has never had surgery on the shoulder before.  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  She denies any numbness and tingling in her hand.  HPI  Review of Systems She currently denies any headache, chest pain, shortness of breath, fever, chills, nausea, vomiting  Objective: Vital Signs: There were no vitals taken for this visit.  Physical Exam She is alert and orient x3 and in no acute distress Ortho Exam  Examination of the left shoulder shows that she is using some of her deltoids to abduct her shoulder.  Her into rotation with adduction is only down to the lower aspect of her lumbar spine.  She has significant positive Neer and Hawkins signs and weakness of the rotator cuff on exam. Specialty Comments:  No specialty comments available.  Imaging: XR Shoulder Left  Result Date: 07/05/2019 3 views of left shoulder show that is well located.  There is no significant glenohumeral arthritic changes but there is significant AC joint arthritic changes.  The subacromial outlet is also decreased.    PMFS History: Patient Active Problem List   Diagnosis Date Noted  . Type II or unspecified type diabetes mellitus without mention of complication, not stated as uncontrolled 10/28/2012    Past Medical History:  Diagnosis Date  . Back pain   . Diabetes mellitus without complication ([place])   . Hypertension   . Kidney stone     No family history on file.  Past Surgical History:  Procedure Laterality Date  . ABDOMINAL HYSTERECTOMY    . GASTRECTOMY    . KNEE ARTHROSCOPY Right   . SHOULDER ARTHROSCOPY Right    Social History   Occupational History  . Not on file  Tobacco Use  . Smoking status: Never Smoker  . Smokeless tobacco: Never Used  Substance and Sexual Activity  . Alcohol use: No  . Drug use: No  . Sexual activity: Not on file

## 2019-07-19 ENCOUNTER — Telehealth: Payer: Self-pay | Admitting: Orthopaedic Surgery

## 2019-07-19 MED ORDER — ACETAMINOPHEN-CODEINE #3 300-30 MG PO TABS: 1.0000 | ORAL_TABLET | Freq: Three times a day (TID) | ORAL | 0 refills | Status: DC | PRN

## 2019-07-19 NOTE — Telephone Encounter (Signed)
See below

## 2019-07-19 NOTE — Telephone Encounter (Signed)
I did send in some Tylenol 3 with codeine to her pharmacy.  That is really all we can do in the strongest we can call him while we await the MRI of her shoulder.

## 2019-07-19 NOTE — Telephone Encounter (Signed)
Patient aware of the below message  

## 2019-07-19 NOTE — Telephone Encounter (Signed)
Patient called advised the injection did not work. Patient said she is taking Advil and it's not giving her any relief either. Patient said she is taking 800 mg of Advil. Patient said nothing is relieving the pain in her left shoulder. The number to contact patient is [phone]

## 2019-08-03 ENCOUNTER — Ambulatory Visit: Payer: BC Managed Care – PPO | Admitting: Orthopaedic Surgery

## 2019-08-05 ENCOUNTER — Other Ambulatory Visit: Payer: Self-pay

## 2019-08-05 ENCOUNTER — Encounter: Payer: Self-pay | Admitting: Orthopaedic Surgery

## 2019-08-05 ENCOUNTER — Ambulatory Visit: Payer: BC Managed Care – PPO | Admitting: Orthopaedic Surgery

## 2019-08-05 DIAGNOSIS — M25512 Pain in left shoulder: Secondary | ICD-10-CM

## 2019-08-05 DIAGNOSIS — G8929 Other chronic pain: Secondary | ICD-10-CM

## 2019-08-05 NOTE — Progress Notes (Signed)
The patient comes in today to go over an MRI of her left shoulder.  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  She has been having pain and decreased motion with that shoulder.  Physical therapy has helped regain some of the motion but she still has significant pain in the shoulder itself.  She is at least had 1 steroid injection in the subacromial outlet and that did not really help.  She does have improved forward flexion abduction of the shoulder but this still show some signs of stiffness and adhesive capsulitis.  She does have significant pain around the [facility] joint.  MRIs reviewed with her in terms of the report.  I do not have the studies.  It does show significant arthrosis of the [facility] joint with edematous changes around the [facility] joint consistent with AC joint arthritis.  The rotator cuff is intact.  There is some signs of adhesive capsulitis as well with thickened tissue.  I do feel that she would benefit from an ultrasound guided left shoulder injection by Dr. Junius Roads.  I think that she needs one in both the [facility] joint and the glenohumeral joint and this will help her the most.  She will continue therapy as well.  She agrees with trying this injection so we will work on getting her on Dr. Junius Roads to schedule.  He can then send her back to me at least 3 to 4 weeks after the injections.  Should continue therapy as well.  All question concerns were answered and addressed.

## 2019-08-09 ENCOUNTER — Encounter: Payer: Self-pay | Admitting: Family Medicine

## 2019-08-09 ENCOUNTER — Ambulatory Visit: Payer: Self-pay

## 2019-08-09 ENCOUNTER — Ambulatory Visit: Payer: BC Managed Care – PPO | Admitting: Family Medicine

## 2019-08-09 ENCOUNTER — Other Ambulatory Visit: Payer: Self-pay

## 2019-08-09 DIAGNOSIS — M19012 Primary osteoarthritis, left shoulder: Secondary | ICD-10-CM | POA: Diagnosis not present

## 2019-08-09 DIAGNOSIS — M25512 Pain in left shoulder: Secondary | ICD-10-CM

## 2019-08-09 DIAGNOSIS — M7502 Adhesive capsulitis of left shoulder: Secondary | ICD-10-CM | POA: Diagnosis not present

## 2019-08-09 NOTE — Progress Notes (Signed)
Subjective: Patient is here for ultrasound-guided intra-articular left glenohumeral injection.  We are also doing AC joint injection.  She has adhesive capsulitis and AC joint arthropathy.  Objective: Decreased range of motion.  She has a tender trigger point in the rhomboid area.  Procedure: Ultrasound-guided left glenohumeral injection: After sterile prep with Betadine, injected 8 cc 1% lidocaine without epinephrine and 40 mg methylprednisolone using a 22-gauge spinal needle, passing the needle from posterior approach into the glenohumeral joint.  Injectate was seen filling the joint capsule.  She had very good immediate relief.  Also injected 3 cc 1% lidocaine without epinephrine and 40 mg methylprednisolone into the [facility] joint using ultrasound to guide needle placement.  Try dry needling in PT.

## 2019-08-10 ENCOUNTER — Encounter: Payer: Self-pay | Admitting: Family Medicine

## 2019-08-10 MED ORDER — TRAMADOL HCL 50 MG PO TABS: 50.0000 mg | ORAL_TABLET | Freq: Four times a day (QID) | ORAL | 0 refills | Status: DC | PRN

## 2019-11-11 ENCOUNTER — Telehealth: Payer: Self-pay | Admitting: Orthopaedic Surgery

## 2019-11-11 NOTE — Telephone Encounter (Signed)
Patient called asked if she can get another injection in her left shoulder by Dr. Junius Roads? The number to contact patient is [phone]

## 2019-11-12 NOTE — Telephone Encounter (Signed)
Yes ma'am she may

## 2019-11-18 ENCOUNTER — Telehealth: Payer: Self-pay | Admitting: Family Medicine

## 2019-11-18 ENCOUNTER — Other Ambulatory Visit: Payer: Self-pay

## 2019-11-18 ENCOUNTER — Ambulatory Visit: Payer: Self-pay

## 2019-11-18 ENCOUNTER — Encounter: Payer: Self-pay | Admitting: Family Medicine

## 2019-11-18 ENCOUNTER — Ambulatory Visit: Payer: BC Managed Care – PPO | Admitting: Family Medicine

## 2019-11-18 DIAGNOSIS — G8929 Other chronic pain: Secondary | ICD-10-CM

## 2019-11-18 DIAGNOSIS — M25512 Pain in left shoulder: Secondary | ICD-10-CM

## 2019-11-18 DIAGNOSIS — M19012 Primary osteoarthritis, left shoulder: Secondary | ICD-10-CM

## 2019-11-18 DIAGNOSIS — M7502 Adhesive capsulitis of left shoulder: Secondary | ICD-10-CM | POA: Diagnosis not present

## 2019-11-18 MED ORDER — TRAMADOL HCL 50 MG PO TABS: 50.0000 mg | ORAL_TABLET | Freq: Four times a day (QID) | ORAL | 0 refills | Status: AC | PRN

## 2019-11-18 NOTE — Telephone Encounter (Signed)
Pt called stating her rx isn't at the pharmacy yet and she would like to have that sent in please

## 2019-11-18 NOTE — Telephone Encounter (Signed)
Can you send in her Tramadol Rx?

## 2019-11-18 NOTE — Telephone Encounter (Signed)
Sent!

## 2019-11-18 NOTE — Progress Notes (Signed)
   Office Visit Note   Patient: Jo Roberts           Date of Birth: 29-May-1955           MRN: [id number] Visit Date: 11/18/2019 Requested by: Vernie Shanks, MD [place],  [place] [id number] PCP: Vernie Shanks, MD  Subjective: Chief Complaint  Patient presents with  . Left Shoulder - Pain    Requesting another cortisone injection. The injections 08/09/19 helped a couple months ([place] [facility]).     HPI: She is here with recurrent left shoulder pain.  Last time in May we injected glenohumeral and AC joint.  She had good relief for about 2 months, she has had gradually increasing pain.  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  She would like the same injections today.              ROS:   All other systems were reviewed and are negative.  Objective: Vital Signs: There were no vitals taken for this visit.  Physical Exam:  General:  Alert and oriented, in no acute distress. Pulm:  Breathing unlabored. Psy:  Normal mood, congruent affect. Skin: No erythema Left shoulder: She has adhesive capsulitis with abduction of 85 degrees, external rotation of 45 and internal rotation of 45.  She is tender at the [facility] joint.  Imaging: US Guided Needle Placement - No Linked Charges  Result Date: 11/18/2019  Ultrasound-guided left glenohumeral injection: After sterile prep with Betadine, injected 8 cc 1% lidocaine without epinephrine and 40 mg methylprednisolone using a 22-gauge spinal needle, passing the needle from posterior approach into the glenohumeral joint.  Injectate was seen filling the joint capsule.  Good immediate relief. Also under ultrasound guidance injected 3 cc 1% lidocaine without epinephrine and 40 mg methylprednisolone into the AC joint.    Assessment & Plan: 1.  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  Tramadol as needed.  Follow-up as  needed.     Procedures: No procedures performed  No notes on file     PMFS History: Patient Active Problem List   Diagnosis Date Noted  . Type II or unspecified type diabetes mellitus without mention of complication, not stated as uncontrolled 10/28/2012   Past Medical History:  Diagnosis Date  . Back pain   . Diabetes mellitus without complication ([place])   . Hypertension   . Kidney stone     History reviewed. No pertinent family history.  Past Surgical History:  Procedure Laterality Date  . ABDOMINAL HYSTERECTOMY    . GASTRECTOMY    . KNEE ARTHROSCOPY Right   . SHOULDER ARTHROSCOPY Right    Social History   Occupational History  . Not on file  Tobacco Use  . Smoking status: Never Smoker  . Smokeless tobacco: Never Used  Substance and Sexual Activity  . Alcohol use: No  . Drug use: No  . Sexual activity: Not on file

## 2020-12-28 DIAGNOSIS — D1801 Hemangioma of skin and subcutaneous tissue: Secondary | ICD-10-CM | POA: Diagnosis not present

## 2020-12-28 DIAGNOSIS — L578 Other skin changes due to chronic exposure to nonionizing radiation: Secondary | ICD-10-CM | POA: Diagnosis not present

## 2020-12-28 DIAGNOSIS — Z86006 Personal history of melanoma in-situ: Secondary | ICD-10-CM | POA: Diagnosis not present

## 2020-12-28 DIAGNOSIS — D225 Melanocytic nevi of trunk: Secondary | ICD-10-CM | POA: Diagnosis not present

## 2020-12-28 DIAGNOSIS — L814 Other melanin hyperpigmentation: Secondary | ICD-10-CM | POA: Diagnosis not present

## 2020-12-28 DIAGNOSIS — D2262 Melanocytic nevi of left upper limb, including shoulder: Secondary | ICD-10-CM | POA: Diagnosis not present

## 2020-12-28 DIAGNOSIS — L821 Other seborrheic keratosis: Secondary | ICD-10-CM | POA: Diagnosis not present

## 2020-12-28 DIAGNOSIS — Z23 Encounter for immunization: Secondary | ICD-10-CM | POA: Diagnosis not present

## 2021-01-05 ENCOUNTER — Encounter (HOSPITAL_BASED_OUTPATIENT_CLINIC_OR_DEPARTMENT_OTHER): Payer: Self-pay

## 2021-01-05 ENCOUNTER — Other Ambulatory Visit: Payer: Self-pay

## 2021-01-05 ENCOUNTER — Emergency Department (HOSPITAL_BASED_OUTPATIENT_CLINIC_OR_DEPARTMENT_OTHER)
Admission: EM | Admit: 2021-01-05 | Discharge: 2021-01-05 | Disposition: A | Discharge status: Home / Self Care | Payer: Medicare PPO | Attending: Emergency Medicine | Admitting: Emergency Medicine

## 2021-01-05 ENCOUNTER — Emergency Department (HOSPITAL_BASED_OUTPATIENT_CLINIC_OR_DEPARTMENT_OTHER): Payer: Medicare PPO

## 2021-01-05 DIAGNOSIS — E119 Type 2 diabetes mellitus without complications: Secondary | ICD-10-CM | POA: Insufficient documentation

## 2021-01-05 DIAGNOSIS — Z7982 Long term (current) use of aspirin: Secondary | ICD-10-CM | POA: Diagnosis not present

## 2021-01-05 DIAGNOSIS — S39012A Strain of muscle, fascia and tendon of lower back, initial encounter: Secondary | ICD-10-CM | POA: Insufficient documentation

## 2021-01-05 DIAGNOSIS — Z96651 Presence of right artificial knee joint: Secondary | ICD-10-CM | POA: Diagnosis not present

## 2021-01-05 DIAGNOSIS — I10 Essential (primary) hypertension: Secondary | ICD-10-CM | POA: Insufficient documentation

## 2021-01-05 DIAGNOSIS — Z79899 Other long term (current) drug therapy: Secondary | ICD-10-CM | POA: Diagnosis not present

## 2021-01-05 DIAGNOSIS — S199XXA Unspecified injury of neck, initial encounter: Secondary | ICD-10-CM | POA: Diagnosis not present

## 2021-01-05 DIAGNOSIS — M542 Cervicalgia: Secondary | ICD-10-CM | POA: Insufficient documentation

## 2021-01-05 DIAGNOSIS — Z96611 Presence of right artificial shoulder joint: Secondary | ICD-10-CM | POA: Diagnosis not present

## 2021-01-05 DIAGNOSIS — Y92410 Unspecified street and highway as the place of occurrence of the external cause: Secondary | ICD-10-CM | POA: Diagnosis not present

## 2021-01-05 DIAGNOSIS — M25551 Pain in right hip: Secondary | ICD-10-CM | POA: Diagnosis not present

## 2021-01-05 DIAGNOSIS — M4802 Spinal stenosis, cervical region: Secondary | ICD-10-CM | POA: Diagnosis not present

## 2021-01-05 DIAGNOSIS — S3992XA Unspecified injury of lower back, initial encounter: Secondary | ICD-10-CM | POA: Diagnosis present

## 2021-01-05 DIAGNOSIS — R109 Unspecified abdominal pain: Secondary | ICD-10-CM | POA: Diagnosis not present

## 2021-01-05 HISTORY — DX: Malignant melanoma of skin, unspecified: C43.9

## 2021-01-05 MED ORDER — IBUPROFEN 400 MG PO TABS
600.0000 mg | ORAL_TABLET | Freq: Once | ORAL | Status: AC
  Administered 2021-01-05: 600 mg via ORAL
  Filled 2021-01-05: qty 1

## 2021-01-05 MED ORDER — METHOCARBAMOL 500 MG PO TABS: 500.0000 mg | ORAL_TABLET | Freq: Two times a day (BID) | ORAL | 0 refills | Status: AC
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
## 2021-03-15 DIAGNOSIS — Z20822 Contact with and (suspected) exposure to covid-19: Secondary | ICD-10-CM | POA: Diagnosis not present

## 2021-03-16 DIAGNOSIS — U071 COVID-19: Secondary | ICD-10-CM | POA: Diagnosis not present

## 2021-04-18 ENCOUNTER — Other Ambulatory Visit (HOSPITAL_BASED_OUTPATIENT_CLINIC_OR_DEPARTMENT_OTHER): Payer: Self-pay | Admitting: Orthopaedic Surgery

## 2021-04-18 ENCOUNTER — Other Ambulatory Visit: Payer: Self-pay

## 2021-04-18 ENCOUNTER — Ambulatory Visit (HOSPITAL_BASED_OUTPATIENT_CLINIC_OR_DEPARTMENT_OTHER)
Admission: RE | Admit: 2021-04-18 | Discharge: 2021-04-18 | Disposition: A | Discharge status: Home / Self Care | Payer: Medicare PPO | Source: Ambulatory Visit | Attending: Orthopaedic Surgery | Admitting: Orthopaedic Surgery

## 2021-04-18 ENCOUNTER — Ambulatory Visit (HOSPITAL_BASED_OUTPATIENT_CLINIC_OR_DEPARTMENT_OTHER): Payer: Medicare PPO | Admitting: Orthopaedic Surgery

## 2021-04-18 DIAGNOSIS — M67959 Unspecified disorder of synovium and tendon, unspecified thigh: Secondary | ICD-10-CM | POA: Diagnosis not present

## 2021-04-18 DIAGNOSIS — M25552 Pain in left hip: Secondary | ICD-10-CM | POA: Diagnosis not present

## 2021-04-18 DIAGNOSIS — M25559 Pain in unspecified hip: Secondary | ICD-10-CM | POA: Diagnosis not present

## 2021-04-18 DIAGNOSIS — M67962 Unspecified disorder of synovium and tendon, left lower leg: Secondary | ICD-10-CM | POA: Diagnosis not present

## 2021-04-18 MED ORDER — TRIAMCINOLONE ACETONIDE 40 MG/ML IJ SUSP
80.0000 mg | INTRAMUSCULAR | Status: AC | PRN
  Administered 2021-04-18: 80 mg via INTRA_ARTICULAR

## 2021-04-18 MED ORDER — LIDOCAINE HCL 1 % IJ SOLN
4.0000 mL | INTRAMUSCULAR | Status: AC | PRN
  Administered 2021-04-18: 4 mL

## 2021-04-18 NOTE — Progress Notes (Signed)
Chief Complaint: Left hip pain     History of Present Illness:    Jo Roberts is a 66 y.o. female presents with left lateral and some groin pain that has been worse over the last several weeks.  She states she is having a hard time with most activities but standing she is noticed a nagging pain on the outside.  She has been experiencing some popping as well.  She is taking arthritis Tylenol as well as using heating pad which helps somewhat.  She is also endorsing some groin pain on lateral based pain is bothering her the most.  She is having a hard time even with just standing.  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    Surgical History:   None  PMH/PSH/Family History/Social History/Meds/Allergies:    Past Medical History:  Diagnosis Date   Back pain    Diabetes mellitus without complication ([facility])    Hypertension    Kidney stone    Melanoma ([place])    Past Surgical History:  Procedure Laterality Date   ABDOMINAL HYSTERECTOMY     GASTRECTOMY     KNEE ARTHROSCOPY Right    MOUTH SURGERY     SHOULDER ARTHROSCOPY Right    Social History   Socioeconomic History   Marital status: Single    Spouse name: Not on file   Number of children: Not on file   Years of education: Not on file   Highest education level: Not on file  Occupational History   Not on file  Tobacco Use   Smoking status: Never   Smokeless tobacco: Never  Vaping Use   Vaping Use: Never used  Substance and Sexual Activity   Alcohol use: Yes    Comment: occ   Drug use: No   Sexual activity: Not on file  Other Topics Concern   Not on file  Social History Narrative   Not on file   Social Determinants of Health   Financial Resource Strain: Not on file  Food Insecurity: Not on file  Transportation Needs: Not on file  Physical Activity: Not on file  Stress: Not on file  Social Connections: Not  on file   No family history on file. Allergies  Allergen Reactions   Codeine     hallucinations   Current Outpatient Medications  Medication Sig Dispense Refill   ALPRAZolam (XANAX) 0.5 MG tablet Take 0.5 mg by mouth at bedtime as needed for sleep (1/2 to 1 tablet as needed).     aspirin 81 MG tablet Take 81 mg by mouth daily.     glucose blood (ACCU-CHEK COMPACT STRIPS) test strip Use as instructed to check blood sugars once a day 100 each 9   traMADol (ULTRAM) 50 MG tablet Take 1-2 tablets (50-100 mg total) by mouth every 6 (six) hours as needed. 20 tablet 0   zolpidem (AMBIEN) 10 MG tablet Take 10 mg by mouth at bedtime as needed for sleep.     No current facility-administered medications for this visit.   No results found.  Review of Systems:   A ROS was performed including pertinent positives and negatives as documented in the HPI.  Physical Exam :   Constitutional: NAD and appears stated age Neurological: Alert and oriented Psych: Appropriate affect  and cooperative There were no vitals taken for this visit.   Comprehensive Musculoskeletal Exam:    Inspection Right Left  Skin No atrophy or gross abnormalities appreciated No atrophy or gross abnormalities appreciated  Palpation    Tenderness None None  Crepitus None None  Range of Motion    Flexion (passive) 120 120  Extension 30 30  IR 30 30 no pain  ER 45 45  Strength    Flexion  5/5 5/5  Extension 5/5 5/5  Special Tests    FABIR Negative Negative  FADER Negative Negative  ER Lag/Capsular Insufficiency Negative Negative  Instability Negative Negative  Sacroiliac pain Negative  Negative   Instability    Generalized Laxity No No  Neurologic    sciatic, femoral, obturator nerves intact to light sensation  Vascular/Lymphatic    DP pulse 2+ 2+  Lumbar Exam    Patient has symmetric lumbar range of motion with negative pain referral to hip     Imaging:   Xray (5 views AP pelvis and left hip): 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   Assessment:   66 year old female with left lateral based hip pain which I believe is consistent with gluteus medius tendinitis.  At this time I would like to perform ultrasound-guided steroid injection laterally perform reassessment.  I did discuss that she does also have some mild hip osteoarthritis that I believe could be flaring up from some gluteus tendinitis and weakness.  I will provide her with a set of hip strengthening exercises and see her back in 2 months  Plan :    -Left hip ultrasound-guided gluteus medius injection performed after verbal consent obtained    Procedure Note  Patient: Jo Roberts             Date of Birth: June 26, 1955           MRN: [id number]             Visit Date: 04/18/2021  Procedures: Visit Diagnoses: No diagnosis found.  Large Joint Inj: L hip joint on 04/18/2021 2:48 PM Indications: pain Details: 22 G 3.5 in needle, ultrasound-guided anterolateral approach  Arthrogram: No  Medications: 4 mL lidocaine 1 %; 80 mg triamcinolone acetonide 40 MG/ML Outcome: tolerated well, no immediate complications Procedure, treatment alternatives, risks and benefits explained, specific risks discussed. Consent was given by the patient. Immediately prior to procedure a time out was called to verify the correct patient, procedure, equipment, support staff and site/side marked as required. Patient was prepped and draped in the usual sterile fashion.          I personally saw and evaluated the patient, and participated in the management and treatment plan.  Vanetta Mulders, MD Attending Physician, Orthopedic Surgery  This document was dictated using Dragon voice recognition software. A reasonable attempt at proof reading has been made to minimize errors.

## 2021-05-23 DIAGNOSIS — U071 COVID-19: Secondary | ICD-10-CM | POA: Diagnosis not present

## 2021-05-23 DIAGNOSIS — J029 Acute pharyngitis, unspecified: Secondary | ICD-10-CM | POA: Diagnosis not present

## 2021-05-23 DIAGNOSIS — R059 Cough, unspecified: Secondary | ICD-10-CM | POA: Diagnosis not present

## 2021-05-26 DIAGNOSIS — Z20822 Contact with and (suspected) exposure to covid-19: Secondary | ICD-10-CM | POA: Diagnosis not present

## 2021-06-07 DIAGNOSIS — R7303 Prediabetes: Secondary | ICD-10-CM | POA: Diagnosis not present

## 2021-06-07 DIAGNOSIS — Z85820 Personal history of malignant melanoma of skin: Secondary | ICD-10-CM | POA: Diagnosis not present

## 2021-06-07 DIAGNOSIS — Z6836 Body mass index (BMI) 36.0-36.9, adult: Secondary | ICD-10-CM | POA: Diagnosis not present

## 2021-06-07 DIAGNOSIS — Z23 Encounter for immunization: Secondary | ICD-10-CM | POA: Diagnosis not present

## 2021-06-07 DIAGNOSIS — I10 Essential (primary) hypertension: Secondary | ICD-10-CM | POA: Diagnosis not present

## 2021-06-07 DIAGNOSIS — R051 Acute cough: Secondary | ICD-10-CM | POA: Diagnosis not present

## 2021-06-07 DIAGNOSIS — G9331 Postviral fatigue syndrome: Secondary | ICD-10-CM | POA: Diagnosis not present

## 2021-06-07 DIAGNOSIS — D696 Thrombocytopenia, unspecified: Secondary | ICD-10-CM | POA: Diagnosis not present

## 2021-06-12 DIAGNOSIS — E119 Type 2 diabetes mellitus without complications: Secondary | ICD-10-CM | POA: Diagnosis not present

## 2021-06-12 DIAGNOSIS — H5213 Myopia, bilateral: Secondary | ICD-10-CM | POA: Diagnosis not present

## 2021-06-12 DIAGNOSIS — H2513 Age-related nuclear cataract, bilateral: Secondary | ICD-10-CM | POA: Diagnosis not present

## 2021-06-12 DIAGNOSIS — H43811 Vitreous degeneration, right eye: Secondary | ICD-10-CM | POA: Diagnosis not present

## 2021-06-13 ENCOUNTER — Ambulatory Visit (HOSPITAL_BASED_OUTPATIENT_CLINIC_OR_DEPARTMENT_OTHER): Payer: Medicare PPO | Admitting: Orthopaedic Surgery

## 2021-07-19 DIAGNOSIS — D225 Melanocytic nevi of trunk: Secondary | ICD-10-CM | POA: Diagnosis not present

## 2021-07-19 DIAGNOSIS — D2262 Melanocytic nevi of left upper limb, including shoulder: Secondary | ICD-10-CM | POA: Diagnosis not present

## 2021-07-19 DIAGNOSIS — Z86006 Personal history of melanoma in-situ: Secondary | ICD-10-CM | POA: Diagnosis not present

## 2021-07-19 DIAGNOSIS — L578 Other skin changes due to chronic exposure to nonionizing radiation: Secondary | ICD-10-CM | POA: Diagnosis not present

## 2021-07-19 DIAGNOSIS — L814 Other melanin hyperpigmentation: Secondary | ICD-10-CM | POA: Diagnosis not present

## 2021-07-19 DIAGNOSIS — L821 Other seborrheic keratosis: Secondary | ICD-10-CM | POA: Diagnosis not present

## 2021-07-19 DIAGNOSIS — D1801 Hemangioma of skin and subcutaneous tissue: Secondary | ICD-10-CM | POA: Diagnosis not present

## 2021-07-19 DIAGNOSIS — D229 Melanocytic nevi, unspecified: Secondary | ICD-10-CM | POA: Diagnosis not present

## 2021-08-24 ENCOUNTER — Other Ambulatory Visit (HOSPITAL_COMMUNITY): Payer: Self-pay

## 2021-12-17 DIAGNOSIS — Z6833 Body mass index (BMI) 33.0-33.9, adult: Secondary | ICD-10-CM | POA: Diagnosis not present

## 2021-12-17 DIAGNOSIS — E669 Obesity, unspecified: Secondary | ICD-10-CM | POA: Diagnosis not present

## 2021-12-17 DIAGNOSIS — E119 Type 2 diabetes mellitus without complications: Secondary | ICD-10-CM | POA: Diagnosis not present

## 2021-12-17 DIAGNOSIS — I10 Essential (primary) hypertension: Secondary | ICD-10-CM | POA: Diagnosis not present

## 2022-01-04 DIAGNOSIS — Z85820 Personal history of malignant melanoma of skin: Secondary | ICD-10-CM | POA: Diagnosis not present

## 2022-01-04 DIAGNOSIS — Z0000 Encounter for general adult medical examination without abnormal findings: Secondary | ICD-10-CM | POA: Diagnosis not present

## 2022-01-04 DIAGNOSIS — D696 Thrombocytopenia, unspecified: Secondary | ICD-10-CM | POA: Diagnosis not present

## 2022-01-04 DIAGNOSIS — M160 Bilateral primary osteoarthritis of hip: Secondary | ICD-10-CM | POA: Diagnosis not present

## 2022-01-04 DIAGNOSIS — G4700 Insomnia, unspecified: Secondary | ICD-10-CM | POA: Diagnosis not present

## 2022-01-04 DIAGNOSIS — G8929 Other chronic pain: Secondary | ICD-10-CM | POA: Diagnosis not present

## 2022-01-04 DIAGNOSIS — E1165 Type 2 diabetes mellitus with hyperglycemia: Secondary | ICD-10-CM | POA: Diagnosis not present

## 2022-01-04 DIAGNOSIS — I10 Essential (primary) hypertension: Secondary | ICD-10-CM | POA: Diagnosis not present

## 2022-01-04 DIAGNOSIS — Z6833 Body mass index (BMI) 33.0-33.9, adult: Secondary | ICD-10-CM | POA: Diagnosis not present

## 2022-01-16 DIAGNOSIS — I10 Essential (primary) hypertension: Secondary | ICD-10-CM | POA: Diagnosis not present

## 2022-01-16 DIAGNOSIS — E119 Type 2 diabetes mellitus without complications: Secondary | ICD-10-CM | POA: Diagnosis not present

## 2022-01-16 DIAGNOSIS — Z6832 Body mass index (BMI) 32.0-32.9, adult: Secondary | ICD-10-CM | POA: Diagnosis not present

## 2022-01-16 DIAGNOSIS — E669 Obesity, unspecified: Secondary | ICD-10-CM | POA: Diagnosis not present

## 2022-02-02 DIAGNOSIS — J069 Acute upper respiratory infection, unspecified: Secondary | ICD-10-CM | POA: Diagnosis not present

## 2022-02-02 DIAGNOSIS — J209 Acute bronchitis, unspecified: Secondary | ICD-10-CM | POA: Diagnosis not present

## 2022-02-02 DIAGNOSIS — J029 Acute pharyngitis, unspecified: Secondary | ICD-10-CM | POA: Diagnosis not present

## 2022-02-02 DIAGNOSIS — R0982 Postnasal drip: Secondary | ICD-10-CM | POA: Diagnosis not present

## 2022-02-02 DIAGNOSIS — Z6833 Body mass index (BMI) 33.0-33.9, adult: Secondary | ICD-10-CM | POA: Diagnosis not present

## 2022-03-19 DIAGNOSIS — I10 Essential (primary) hypertension: Secondary | ICD-10-CM | POA: Diagnosis not present

## 2022-03-19 DIAGNOSIS — E119 Type 2 diabetes mellitus without complications: Secondary | ICD-10-CM | POA: Diagnosis not present

## 2022-03-19 DIAGNOSIS — Z6833 Body mass index (BMI) 33.0-33.9, adult: Secondary | ICD-10-CM | POA: Diagnosis not present

## 2022-03-19 DIAGNOSIS — E669 Obesity, unspecified: Secondary | ICD-10-CM | POA: Diagnosis not present

## 2022-04-22 DIAGNOSIS — I10 Essential (primary) hypertension: Secondary | ICD-10-CM | POA: Diagnosis not present

## 2022-04-22 DIAGNOSIS — E669 Obesity, unspecified: Secondary | ICD-10-CM | POA: Diagnosis not present

## 2022-04-22 DIAGNOSIS — Z6832 Body mass index (BMI) 32.0-32.9, adult: Secondary | ICD-10-CM | POA: Diagnosis not present

## 2022-04-22 DIAGNOSIS — E119 Type 2 diabetes mellitus without complications: Secondary | ICD-10-CM | POA: Diagnosis not present

## 2022-04-22 DIAGNOSIS — E65 Localized adiposity: Secondary | ICD-10-CM | POA: Diagnosis not present

## 2022-05-15 DIAGNOSIS — L578 Other skin changes due to chronic exposure to nonionizing radiation: Secondary | ICD-10-CM | POA: Diagnosis not present

## 2022-05-15 DIAGNOSIS — D1801 Hemangioma of skin and subcutaneous tissue: Secondary | ICD-10-CM | POA: Diagnosis not present

## 2022-05-15 DIAGNOSIS — Z86006 Personal history of melanoma in-situ: Secondary | ICD-10-CM | POA: Diagnosis not present

## 2022-05-15 DIAGNOSIS — L570 Actinic keratosis: Secondary | ICD-10-CM | POA: Diagnosis not present

## 2022-05-15 DIAGNOSIS — L814 Other melanin hyperpigmentation: Secondary | ICD-10-CM | POA: Diagnosis not present

## 2022-05-15 DIAGNOSIS — D229 Melanocytic nevi, unspecified: Secondary | ICD-10-CM | POA: Diagnosis not present

## 2022-05-15 DIAGNOSIS — L821 Other seborrheic keratosis: Secondary | ICD-10-CM | POA: Diagnosis not present

## 2022-05-22 DIAGNOSIS — Z6832 Body mass index (BMI) 32.0-32.9, adult: Secondary | ICD-10-CM | POA: Diagnosis not present

## 2022-05-22 DIAGNOSIS — I10 Essential (primary) hypertension: Secondary | ICD-10-CM | POA: Diagnosis not present

## 2022-05-22 DIAGNOSIS — E65 Localized adiposity: Secondary | ICD-10-CM | POA: Diagnosis not present

## 2022-05-22 DIAGNOSIS — E119 Type 2 diabetes mellitus without complications: Secondary | ICD-10-CM | POA: Diagnosis not present

## 2022-05-22 DIAGNOSIS — E669 Obesity, unspecified: Secondary | ICD-10-CM | POA: Diagnosis not present

## 2022-06-18 DIAGNOSIS — E119 Type 2 diabetes mellitus without complications: Secondary | ICD-10-CM | POA: Diagnosis not present

## 2022-06-18 DIAGNOSIS — H524 Presbyopia: Secondary | ICD-10-CM | POA: Diagnosis not present

## 2022-06-18 DIAGNOSIS — H2513 Age-related nuclear cataract, bilateral: Secondary | ICD-10-CM | POA: Diagnosis not present

## 2022-06-18 DIAGNOSIS — H43811 Vitreous degeneration, right eye: Secondary | ICD-10-CM | POA: Diagnosis not present

## 2022-06-25 DIAGNOSIS — Z6833 Body mass index (BMI) 33.0-33.9, adult: Secondary | ICD-10-CM | POA: Diagnosis not present

## 2022-06-25 DIAGNOSIS — G4700 Insomnia, unspecified: Secondary | ICD-10-CM | POA: Diagnosis not present

## 2022-06-25 DIAGNOSIS — I10 Essential (primary) hypertension: Secondary | ICD-10-CM | POA: Diagnosis not present

## 2022-06-25 DIAGNOSIS — F411 Generalized anxiety disorder: Secondary | ICD-10-CM | POA: Diagnosis not present

## 2022-06-25 DIAGNOSIS — E668 Other obesity: Secondary | ICD-10-CM | POA: Diagnosis not present

## 2022-06-25 DIAGNOSIS — E1165 Type 2 diabetes mellitus with hyperglycemia: Secondary | ICD-10-CM | POA: Diagnosis not present

## 2022-06-25 DIAGNOSIS — Z79899 Other long term (current) drug therapy: Secondary | ICD-10-CM | POA: Diagnosis not present

## 2022-07-01 DIAGNOSIS — E65 Localized adiposity: Secondary | ICD-10-CM | POA: Diagnosis not present

## 2022-07-01 DIAGNOSIS — I10 Essential (primary) hypertension: Secondary | ICD-10-CM | POA: Diagnosis not present

## 2022-07-01 DIAGNOSIS — E119 Type 2 diabetes mellitus without complications: Secondary | ICD-10-CM | POA: Diagnosis not present

## 2022-07-01 DIAGNOSIS — Z6832 Body mass index (BMI) 32.0-32.9, adult: Secondary | ICD-10-CM | POA: Diagnosis not present

## 2022-07-01 DIAGNOSIS — E669 Obesity, unspecified: Secondary | ICD-10-CM | POA: Diagnosis not present

## 2022-07-30 DIAGNOSIS — Z6832 Body mass index (BMI) 32.0-32.9, adult: Secondary | ICD-10-CM | POA: Diagnosis not present

## 2022-07-30 DIAGNOSIS — E119 Type 2 diabetes mellitus without complications: Secondary | ICD-10-CM | POA: Diagnosis not present

## 2022-07-30 DIAGNOSIS — I10 Essential (primary) hypertension: Secondary | ICD-10-CM | POA: Diagnosis not present

## 2022-07-30 DIAGNOSIS — E669 Obesity, unspecified: Secondary | ICD-10-CM | POA: Diagnosis not present

## 2022-07-30 DIAGNOSIS — E65 Localized adiposity: Secondary | ICD-10-CM | POA: Diagnosis not present

## 2022-09-26 DIAGNOSIS — H2513 Age-related nuclear cataract, bilateral: Secondary | ICD-10-CM | POA: Diagnosis not present

## 2022-09-26 DIAGNOSIS — H25013 Cortical age-related cataract, bilateral: Secondary | ICD-10-CM | POA: Diagnosis not present

## 2022-10-10 DIAGNOSIS — E119 Type 2 diabetes mellitus without complications: Secondary | ICD-10-CM | POA: Diagnosis not present

## 2022-10-10 DIAGNOSIS — Z6832 Body mass index (BMI) 32.0-32.9, adult: Secondary | ICD-10-CM | POA: Diagnosis not present

## 2022-10-10 DIAGNOSIS — I10 Essential (primary) hypertension: Secondary | ICD-10-CM | POA: Diagnosis not present

## 2022-10-10 DIAGNOSIS — E669 Obesity, unspecified: Secondary | ICD-10-CM | POA: Diagnosis not present

## 2022-10-10 DIAGNOSIS — E65 Localized adiposity: Secondary | ICD-10-CM | POA: Diagnosis not present

## 2022-11-13 DIAGNOSIS — L570 Actinic keratosis: Secondary | ICD-10-CM | POA: Diagnosis not present

## 2022-11-13 DIAGNOSIS — D1801 Hemangioma of skin and subcutaneous tissue: Secondary | ICD-10-CM | POA: Diagnosis not present

## 2022-11-13 DIAGNOSIS — Z86006 Personal history of melanoma in-situ: Secondary | ICD-10-CM | POA: Diagnosis not present

## 2022-11-13 DIAGNOSIS — L821 Other seborrheic keratosis: Secondary | ICD-10-CM | POA: Diagnosis not present

## 2022-11-13 DIAGNOSIS — D229 Melanocytic nevi, unspecified: Secondary | ICD-10-CM | POA: Diagnosis not present

## 2022-11-13 DIAGNOSIS — L578 Other skin changes due to chronic exposure to nonionizing radiation: Secondary | ICD-10-CM | POA: Diagnosis not present

## 2022-11-13 DIAGNOSIS — L814 Other melanin hyperpigmentation: Secondary | ICD-10-CM | POA: Diagnosis not present

## 2022-11-14 DIAGNOSIS — E669 Obesity, unspecified: Secondary | ICD-10-CM | POA: Diagnosis not present

## 2022-11-14 DIAGNOSIS — I10 Essential (primary) hypertension: Secondary | ICD-10-CM | POA: Diagnosis not present

## 2022-11-14 DIAGNOSIS — E119 Type 2 diabetes mellitus without complications: Secondary | ICD-10-CM | POA: Diagnosis not present

## 2022-11-14 DIAGNOSIS — Z6832 Body mass index (BMI) 32.0-32.9, adult: Secondary | ICD-10-CM | POA: Diagnosis not present

## 2022-11-14 DIAGNOSIS — E65 Localized adiposity: Secondary | ICD-10-CM | POA: Diagnosis not present

## 2022-11-19 DIAGNOSIS — Z961 Presence of intraocular lens: Secondary | ICD-10-CM | POA: Diagnosis not present

## 2022-11-19 DIAGNOSIS — H25811 Combined forms of age-related cataract, right eye: Secondary | ICD-10-CM | POA: Diagnosis not present

## 2022-11-19 DIAGNOSIS — H2511 Age-related nuclear cataract, right eye: Secondary | ICD-10-CM | POA: Diagnosis not present

## 2022-11-19 DIAGNOSIS — H25011 Cortical age-related cataract, right eye: Secondary | ICD-10-CM | POA: Diagnosis not present

## 2022-11-26 IMAGING — DX DG HIP (WITH OR WITHOUT PELVIS) 4+V*L*
5 series · 5 of 5 positions shown · non-contrast
Comparison: None.

CLINICAL DATA: Left hip pain.

EXAM:
DG HIP (WITH OR WITHOUT PELVIS) 4+V LEFT

[pelvis ap]
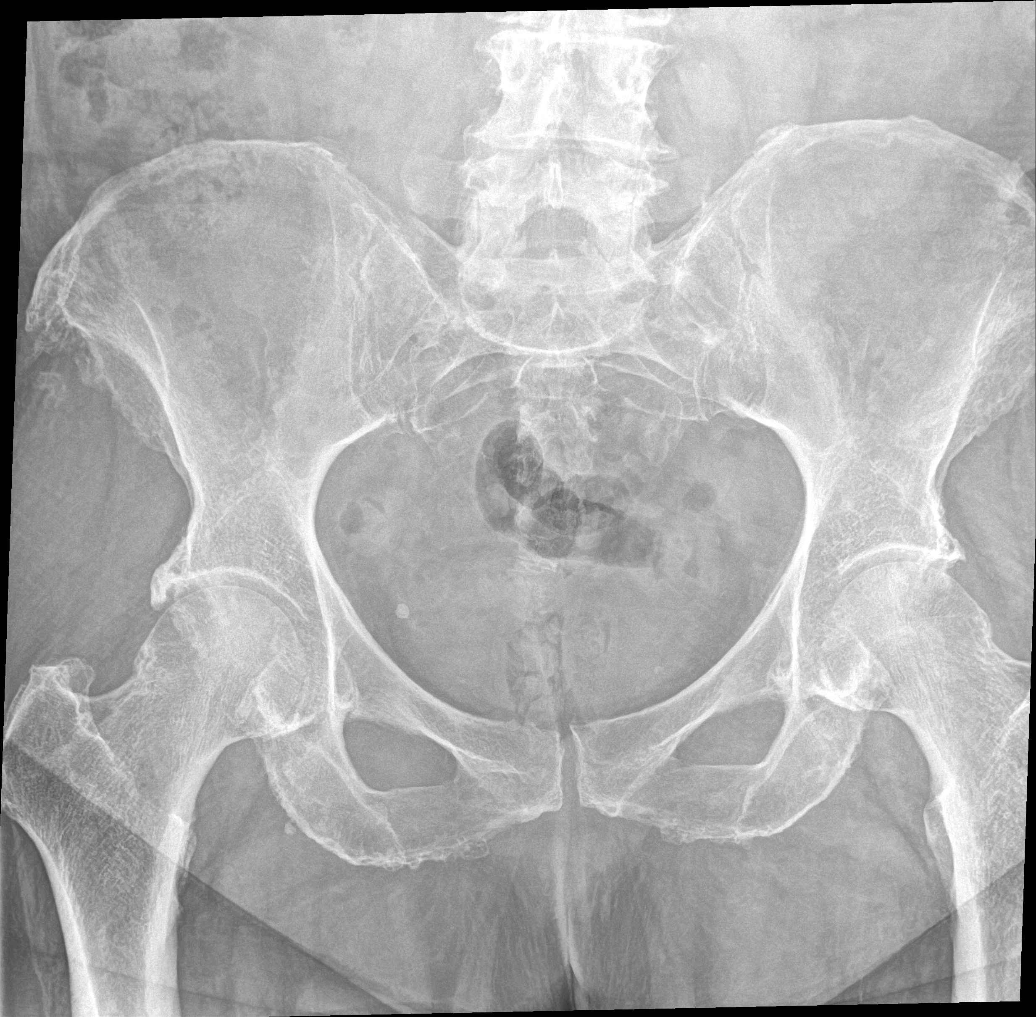

[hip ap]
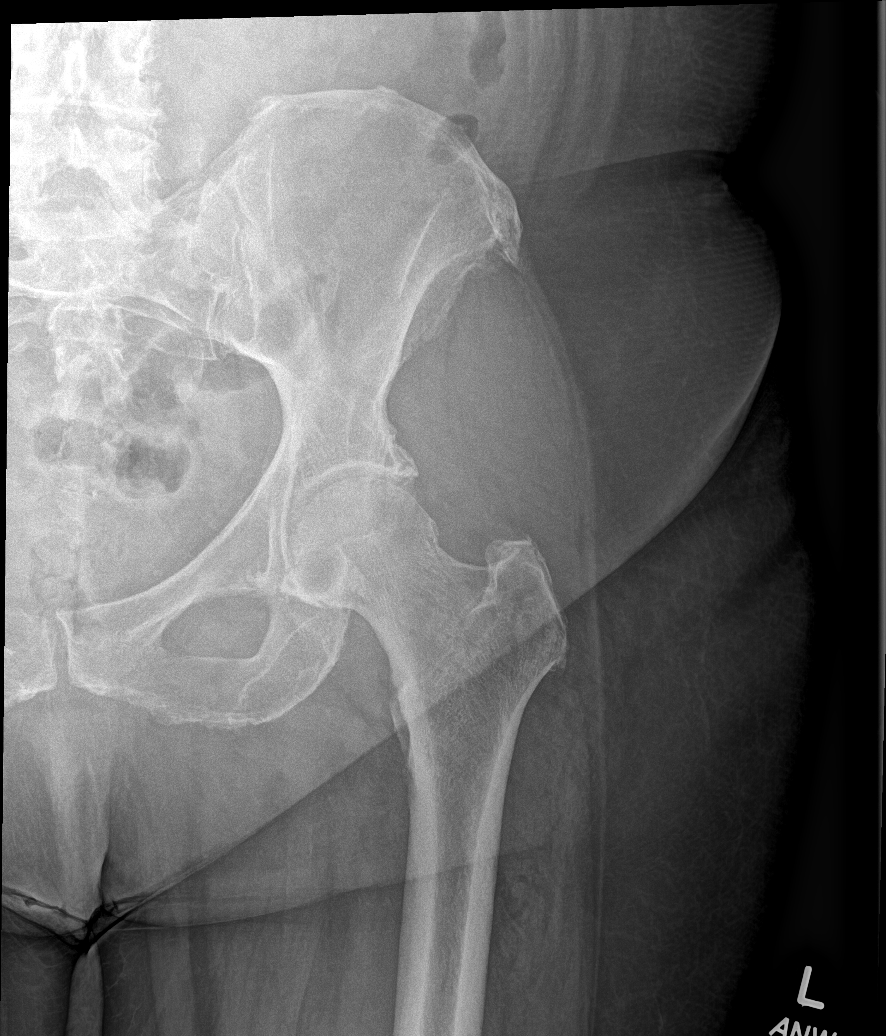

[hip frog leg (1 of 3)]
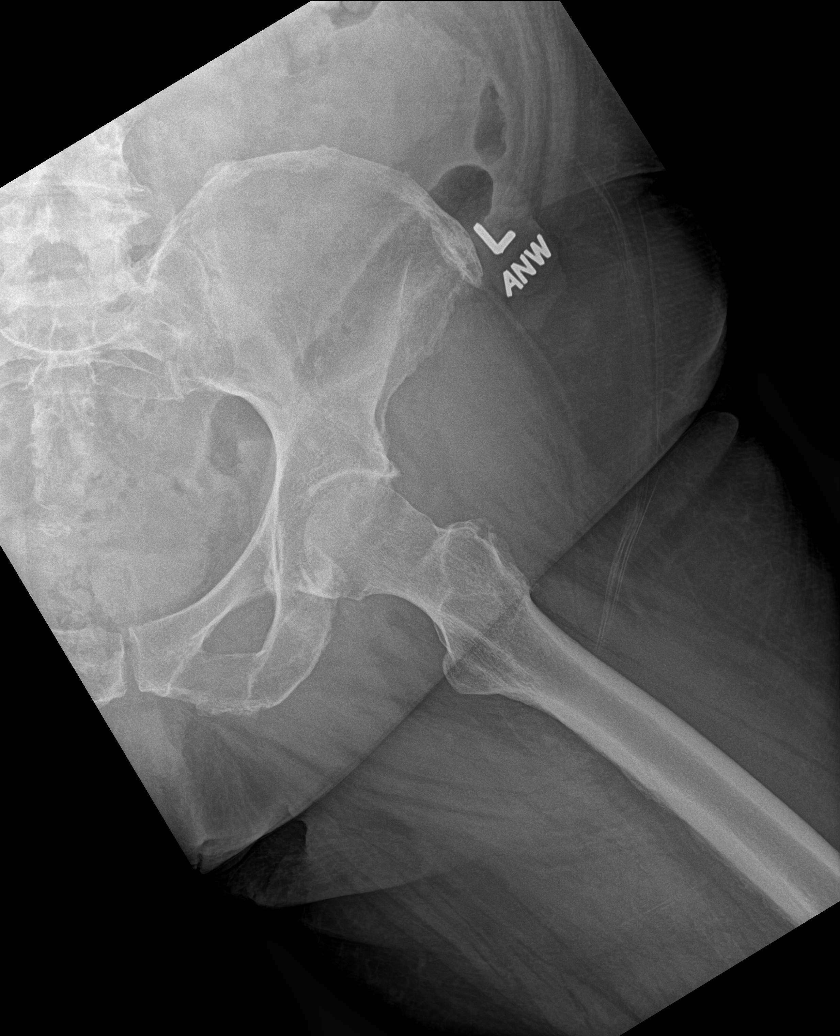

[hip frog leg (2 of 3)]
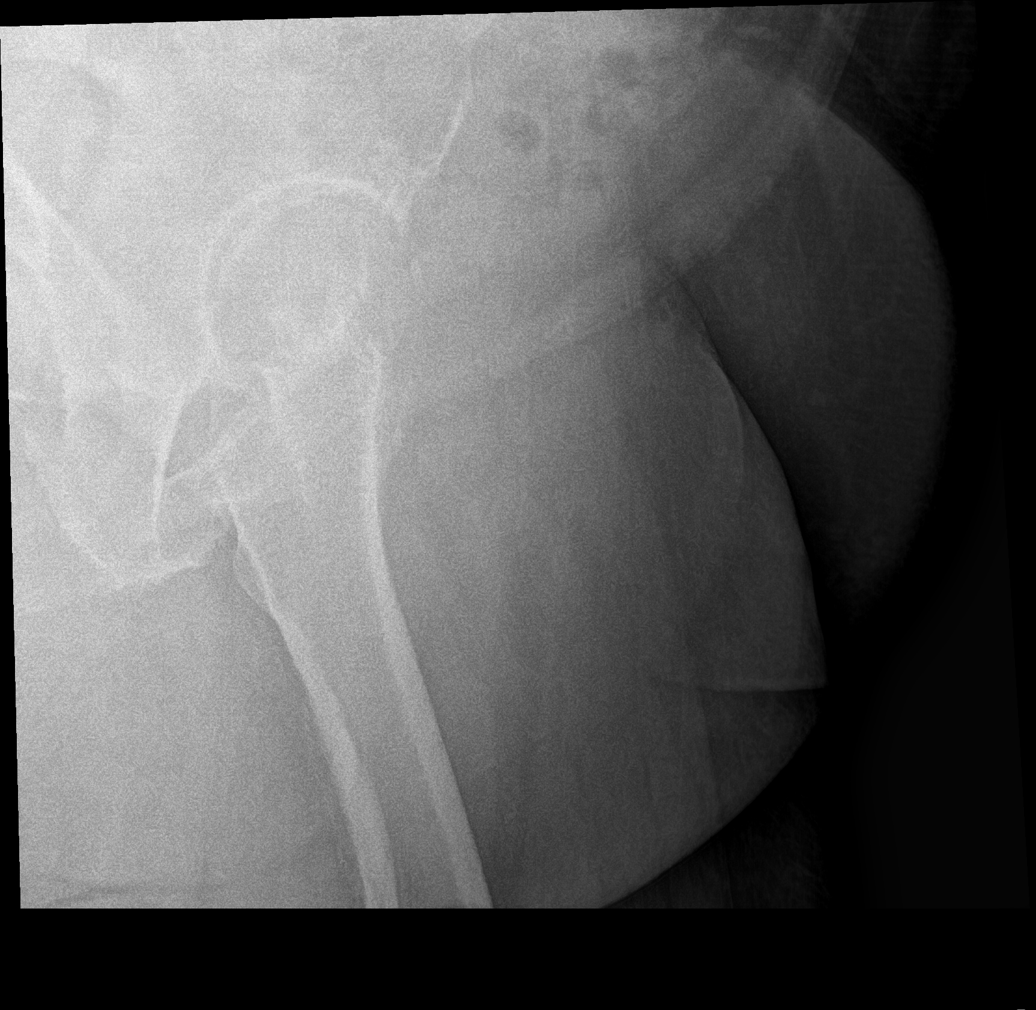

[hip frog leg (3 of 3)]
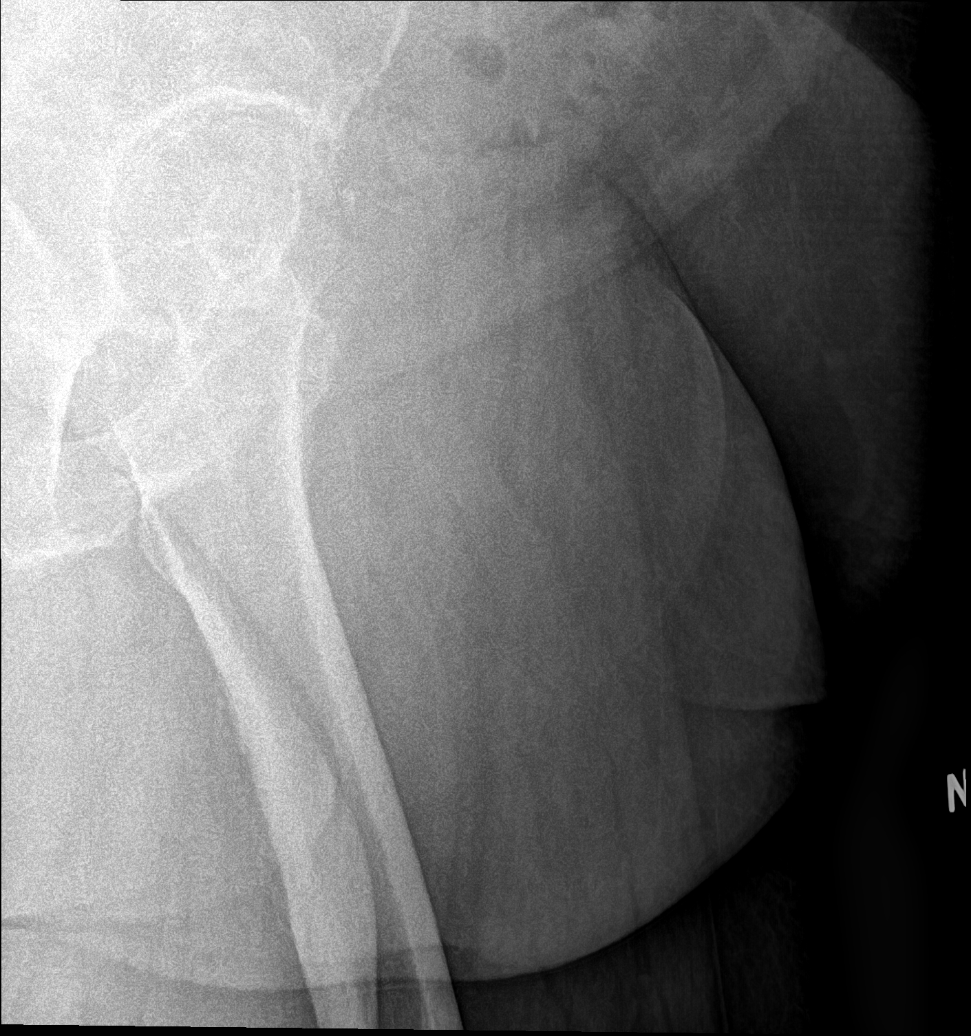

[5 of 5 positions shown; findings below may reference images not displayed]

FINDINGS: There is diffuse decreased bone mineralization. Mild bilateral
superior femoroacetabular joint space narrowing. Moderate right and
mild left superolateral acetabular degenerative osteophytosis. Mild
bilateral femoral head-neck junction degenerative osteophytosis.
Minimal convexity of the anterior superior left femoral head-neck
junction, mild CAM-type bump deformity.

The bilateral sacroiliac joints are maintained. The pubic symphysis
joint is maintained. Mild-to-moderate L3-4 and L4-5 disc space
narrowing. No acute fracture or dislocation. A vascular phlebolith
overlies the right hemipelvis.
IMPRESSION: Mild-to-moderate bilateral femoroacetabular osteoarthritis.

## 2022-12-03 DIAGNOSIS — I10 Essential (primary) hypertension: Secondary | ICD-10-CM | POA: Diagnosis not present

## 2022-12-03 DIAGNOSIS — F5104 Psychophysiologic insomnia: Secondary | ICD-10-CM | POA: Diagnosis not present

## 2022-12-03 DIAGNOSIS — Z6833 Body mass index (BMI) 33.0-33.9, adult: Secondary | ICD-10-CM | POA: Diagnosis not present

## 2022-12-03 DIAGNOSIS — E6609 Other obesity due to excess calories: Secondary | ICD-10-CM | POA: Diagnosis not present

## 2022-12-03 DIAGNOSIS — E119 Type 2 diabetes mellitus without complications: Secondary | ICD-10-CM | POA: Diagnosis not present

## 2022-12-03 DIAGNOSIS — J069 Acute upper respiratory infection, unspecified: Secondary | ICD-10-CM | POA: Diagnosis not present

## 2022-12-03 DIAGNOSIS — F411 Generalized anxiety disorder: Secondary | ICD-10-CM | POA: Diagnosis not present

## 2022-12-17 DIAGNOSIS — E119 Type 2 diabetes mellitus without complications: Secondary | ICD-10-CM | POA: Diagnosis not present

## 2022-12-17 DIAGNOSIS — E669 Obesity, unspecified: Secondary | ICD-10-CM | POA: Diagnosis not present

## 2022-12-17 DIAGNOSIS — E65 Localized adiposity: Secondary | ICD-10-CM | POA: Diagnosis not present

## 2022-12-17 DIAGNOSIS — Z6831 Body mass index (BMI) 31.0-31.9, adult: Secondary | ICD-10-CM | POA: Diagnosis not present

## 2022-12-17 DIAGNOSIS — I10 Essential (primary) hypertension: Secondary | ICD-10-CM | POA: Diagnosis not present

## 2022-12-24 DIAGNOSIS — H2512 Age-related nuclear cataract, left eye: Secondary | ICD-10-CM | POA: Diagnosis not present

## 2022-12-24 DIAGNOSIS — H25812 Combined forms of age-related cataract, left eye: Secondary | ICD-10-CM | POA: Diagnosis not present

## 2022-12-24 DIAGNOSIS — H25012 Cortical age-related cataract, left eye: Secondary | ICD-10-CM | POA: Diagnosis not present

## 2023-01-10 DIAGNOSIS — Z1231 Encounter for screening mammogram for malignant neoplasm of breast: Secondary | ICD-10-CM | POA: Diagnosis not present

## 2023-01-23 DIAGNOSIS — E119 Type 2 diabetes mellitus without complications: Secondary | ICD-10-CM | POA: Diagnosis not present

## 2023-01-23 DIAGNOSIS — E669 Obesity, unspecified: Secondary | ICD-10-CM | POA: Diagnosis not present

## 2023-01-23 DIAGNOSIS — I10 Essential (primary) hypertension: Secondary | ICD-10-CM | POA: Diagnosis not present

## 2023-01-23 DIAGNOSIS — E65 Localized adiposity: Secondary | ICD-10-CM | POA: Diagnosis not present

## 2023-01-23 DIAGNOSIS — Z6832 Body mass index (BMI) 32.0-32.9, adult: Secondary | ICD-10-CM | POA: Diagnosis not present

## 2023-02-03 DIAGNOSIS — Z0000 Encounter for general adult medical examination without abnormal findings: Secondary | ICD-10-CM | POA: Diagnosis not present

## 2023-02-03 DIAGNOSIS — E2839 Other primary ovarian failure: Secondary | ICD-10-CM | POA: Diagnosis not present

## 2023-02-03 DIAGNOSIS — E119 Type 2 diabetes mellitus without complications: Secondary | ICD-10-CM | POA: Diagnosis not present

## 2023-02-03 DIAGNOSIS — Z1211 Encounter for screening for malignant neoplasm of colon: Secondary | ICD-10-CM | POA: Diagnosis not present

## 2023-02-03 DIAGNOSIS — I10 Essential (primary) hypertension: Secondary | ICD-10-CM | POA: Diagnosis not present

## 2023-03-27 DIAGNOSIS — N958 Other specified menopausal and perimenopausal disorders: Secondary | ICD-10-CM | POA: Diagnosis not present

## 2023-03-27 DIAGNOSIS — E2839 Other primary ovarian failure: Secondary | ICD-10-CM | POA: Diagnosis not present

## 2023-04-09 DIAGNOSIS — E119 Type 2 diabetes mellitus without complications: Secondary | ICD-10-CM | POA: Diagnosis not present

## 2023-04-09 DIAGNOSIS — E669 Obesity, unspecified: Secondary | ICD-10-CM | POA: Diagnosis not present

## 2023-04-09 DIAGNOSIS — I10 Essential (primary) hypertension: Secondary | ICD-10-CM | POA: Diagnosis not present

## 2023-04-09 DIAGNOSIS — E65 Localized adiposity: Secondary | ICD-10-CM | POA: Diagnosis not present

## 2023-04-09 DIAGNOSIS — Z6832 Body mass index (BMI) 32.0-32.9, adult: Secondary | ICD-10-CM | POA: Diagnosis not present

## 2023-07-07 DIAGNOSIS — E119 Type 2 diabetes mellitus without complications: Secondary | ICD-10-CM | POA: Diagnosis not present

## 2023-07-07 DIAGNOSIS — E669 Obesity, unspecified: Secondary | ICD-10-CM | POA: Diagnosis not present

## 2023-07-07 DIAGNOSIS — E65 Localized adiposity: Secondary | ICD-10-CM | POA: Diagnosis not present

## 2023-07-07 DIAGNOSIS — Z6833 Body mass index (BMI) 33.0-33.9, adult: Secondary | ICD-10-CM | POA: Diagnosis not present

## 2023-07-07 DIAGNOSIS — E66811 Obesity, class 1: Secondary | ICD-10-CM | POA: Diagnosis not present

## 2023-07-07 DIAGNOSIS — I10 Essential (primary) hypertension: Secondary | ICD-10-CM | POA: Diagnosis not present

## 2023-08-01 DIAGNOSIS — S46819A Strain of other muscles, fascia and tendons at shoulder and upper arm level, unspecified arm, initial encounter: Secondary | ICD-10-CM | POA: Diagnosis not present

## 2023-08-05 DIAGNOSIS — F5104 Psychophysiologic insomnia: Secondary | ICD-10-CM | POA: Diagnosis not present

## 2023-08-05 DIAGNOSIS — I10 Essential (primary) hypertension: Secondary | ICD-10-CM | POA: Diagnosis not present

## 2023-08-05 DIAGNOSIS — E119 Type 2 diabetes mellitus without complications: Secondary | ICD-10-CM | POA: Diagnosis not present

## 2023-08-05 DIAGNOSIS — F411 Generalized anxiety disorder: Secondary | ICD-10-CM | POA: Diagnosis not present

## 2023-08-06 DIAGNOSIS — E162 Hypoglycemia, unspecified: Secondary | ICD-10-CM | POA: Diagnosis not present

## 2023-08-06 DIAGNOSIS — E669 Obesity, unspecified: Secondary | ICD-10-CM | POA: Diagnosis not present

## 2023-08-06 DIAGNOSIS — E65 Localized adiposity: Secondary | ICD-10-CM | POA: Diagnosis not present

## 2023-08-06 DIAGNOSIS — E119 Type 2 diabetes mellitus without complications: Secondary | ICD-10-CM | POA: Diagnosis not present

## 2023-08-06 DIAGNOSIS — Z6833 Body mass index (BMI) 33.0-33.9, adult: Secondary | ICD-10-CM | POA: Diagnosis not present

## 2023-08-06 DIAGNOSIS — I10 Essential (primary) hypertension: Secondary | ICD-10-CM | POA: Diagnosis not present

## 2023-09-04 DIAGNOSIS — E119 Type 2 diabetes mellitus without complications: Secondary | ICD-10-CM | POA: Diagnosis not present

## 2023-09-04 DIAGNOSIS — Z6832 Body mass index (BMI) 32.0-32.9, adult: Secondary | ICD-10-CM | POA: Diagnosis not present

## 2023-09-04 DIAGNOSIS — E669 Obesity, unspecified: Secondary | ICD-10-CM | POA: Diagnosis not present

## 2023-09-04 DIAGNOSIS — E162 Hypoglycemia, unspecified: Secondary | ICD-10-CM | POA: Diagnosis not present

## 2023-09-04 DIAGNOSIS — E65 Localized adiposity: Secondary | ICD-10-CM | POA: Diagnosis not present

## 2023-09-04 DIAGNOSIS — I10 Essential (primary) hypertension: Secondary | ICD-10-CM | POA: Diagnosis not present

## 2023-09-25 DIAGNOSIS — E119 Type 2 diabetes mellitus without complications: Secondary | ICD-10-CM | POA: Diagnosis not present

## 2023-09-25 DIAGNOSIS — Z6832 Body mass index (BMI) 32.0-32.9, adult: Secondary | ICD-10-CM | POA: Diagnosis not present

## 2023-09-25 DIAGNOSIS — I10 Essential (primary) hypertension: Secondary | ICD-10-CM | POA: Diagnosis not present

## 2023-09-25 DIAGNOSIS — E162 Hypoglycemia, unspecified: Secondary | ICD-10-CM | POA: Diagnosis not present

## 2023-09-25 DIAGNOSIS — E669 Obesity, unspecified: Secondary | ICD-10-CM | POA: Diagnosis not present

## 2023-09-25 DIAGNOSIS — E65 Localized adiposity: Secondary | ICD-10-CM | POA: Diagnosis not present

## 2023-11-13 DIAGNOSIS — I10 Essential (primary) hypertension: Secondary | ICD-10-CM | POA: Diagnosis not present

## 2023-11-13 DIAGNOSIS — Z6832 Body mass index (BMI) 32.0-32.9, adult: Secondary | ICD-10-CM | POA: Diagnosis not present

## 2023-11-13 DIAGNOSIS — E669 Obesity, unspecified: Secondary | ICD-10-CM | POA: Diagnosis not present

## 2023-11-13 DIAGNOSIS — E65 Localized adiposity: Secondary | ICD-10-CM | POA: Diagnosis not present

## 2023-11-13 DIAGNOSIS — E162 Hypoglycemia, unspecified: Secondary | ICD-10-CM | POA: Diagnosis not present

## 2023-11-13 DIAGNOSIS — E119 Type 2 diabetes mellitus without complications: Secondary | ICD-10-CM | POA: Diagnosis not present

## 2023-12-25 DIAGNOSIS — I10 Essential (primary) hypertension: Secondary | ICD-10-CM | POA: Diagnosis not present

## 2023-12-25 DIAGNOSIS — Z6832 Body mass index (BMI) 32.0-32.9, adult: Secondary | ICD-10-CM | POA: Diagnosis not present

## 2023-12-25 DIAGNOSIS — E669 Obesity, unspecified: Secondary | ICD-10-CM | POA: Diagnosis not present

## 2023-12-25 DIAGNOSIS — E162 Hypoglycemia, unspecified: Secondary | ICD-10-CM | POA: Diagnosis not present

## 2023-12-25 DIAGNOSIS — E119 Type 2 diabetes mellitus without complications: Secondary | ICD-10-CM | POA: Diagnosis not present

## 2023-12-25 DIAGNOSIS — E65 Localized adiposity: Secondary | ICD-10-CM | POA: Diagnosis not present

## 2024-01-12 DIAGNOSIS — L814 Other melanin hyperpigmentation: Secondary | ICD-10-CM | POA: Diagnosis not present

## 2024-01-12 DIAGNOSIS — L578 Other skin changes due to chronic exposure to nonionizing radiation: Secondary | ICD-10-CM | POA: Diagnosis not present

## 2024-01-12 DIAGNOSIS — L821 Other seborrheic keratosis: Secondary | ICD-10-CM | POA: Diagnosis not present

## 2024-01-12 DIAGNOSIS — Z86006 Personal history of melanoma in-situ: Secondary | ICD-10-CM | POA: Diagnosis not present

## 2024-01-12 DIAGNOSIS — L570 Actinic keratosis: Secondary | ICD-10-CM | POA: Diagnosis not present

## 2024-01-12 DIAGNOSIS — D1801 Hemangioma of skin and subcutaneous tissue: Secondary | ICD-10-CM | POA: Diagnosis not present

## 2024-01-12 DIAGNOSIS — D229 Melanocytic nevi, unspecified: Secondary | ICD-10-CM | POA: Diagnosis not present

## 2024-01-22 DIAGNOSIS — Z1231 Encounter for screening mammogram for malignant neoplasm of breast: Secondary | ICD-10-CM | POA: Diagnosis not present

## 2024-02-06 DIAGNOSIS — F5104 Psychophysiologic insomnia: Secondary | ICD-10-CM | POA: Diagnosis not present

## 2024-02-06 DIAGNOSIS — Z0000 Encounter for general adult medical examination without abnormal findings: Secondary | ICD-10-CM | POA: Diagnosis not present

## 2024-02-06 DIAGNOSIS — I10 Essential (primary) hypertension: Secondary | ICD-10-CM | POA: Diagnosis not present

## 2024-02-06 DIAGNOSIS — E119 Type 2 diabetes mellitus without complications: Secondary | ICD-10-CM | POA: Diagnosis not present

## 2024-02-06 DIAGNOSIS — Z1211 Encounter for screening for malignant neoplasm of colon: Secondary | ICD-10-CM | POA: Diagnosis not present

## 2024-02-06 DIAGNOSIS — F411 Generalized anxiety disorder: Secondary | ICD-10-CM | POA: Diagnosis not present

## 2024-02-06 DIAGNOSIS — E7800 Pure hypercholesterolemia, unspecified: Secondary | ICD-10-CM | POA: Diagnosis not present

## 2024-02-06 DIAGNOSIS — Z23 Encounter for immunization: Secondary | ICD-10-CM | POA: Diagnosis not present

## 2024-02-16 DIAGNOSIS — E119 Type 2 diabetes mellitus without complications: Secondary | ICD-10-CM | POA: Diagnosis not present

## 2024-02-16 DIAGNOSIS — E162 Hypoglycemia, unspecified: Secondary | ICD-10-CM | POA: Diagnosis not present

## 2024-02-16 DIAGNOSIS — Z6833 Body mass index (BMI) 33.0-33.9, adult: Secondary | ICD-10-CM | POA: Diagnosis not present

## 2024-02-16 DIAGNOSIS — I10 Essential (primary) hypertension: Secondary | ICD-10-CM | POA: Diagnosis not present

## 2024-02-16 DIAGNOSIS — E669 Obesity, unspecified: Secondary | ICD-10-CM | POA: Diagnosis not present

## 2024-02-16 DIAGNOSIS — E65 Localized adiposity: Secondary | ICD-10-CM | POA: Diagnosis not present
